# Patient Record
Sex: Female | Born: 1945 | Race: Black or African American | Hispanic: No | Marital: Married | Smoking: Never smoker
Health system: Southern US, Community
[De-identification: ages and names within clinical notes are randomized; demographics above are authoritative.]

## PROBLEM LIST (undated history)

## (undated) DIAGNOSIS — M1990 Unspecified osteoarthritis, unspecified site: Secondary | ICD-10-CM

## (undated) DIAGNOSIS — I10 Essential (primary) hypertension: Secondary | ICD-10-CM

## (undated) HISTORY — DX: Unspecified osteoarthritis, unspecified site: M19.90

## (undated) HISTORY — PX: ABDOMINAL HYSTERECTOMY: SHX81

## (undated) HISTORY — PX: GALLBLADDER SURGERY: SHX652

## (undated) HISTORY — DX: Essential (primary) hypertension: I10

---

## 2004-06-20 ENCOUNTER — Emergency Department (HOSPITAL_COMMUNITY): Admission: EM | Admit: 2004-06-20 | Discharge: 2004-06-20 | Payer: Self-pay | Admitting: Family Medicine

## 2008-03-21 ENCOUNTER — Encounter: Admission: RE | Admit: 2008-03-21 | Discharge: 2008-03-21 | Payer: Self-pay | Admitting: Internal Medicine

## 2008-05-31 ENCOUNTER — Ambulatory Visit (HOSPITAL_COMMUNITY): Admission: RE | Admit: 2008-05-31 | Discharge: 2008-05-31 | Payer: Self-pay | Admitting: Surgery

## 2008-05-31 ENCOUNTER — Encounter (INDEPENDENT_AMBULATORY_CARE_PROVIDER_SITE_OTHER): Payer: Self-pay | Admitting: Surgery

## 2009-05-03 IMAGING — US US ABDOMEN COMPLETE
1 series · 13 of 25 positions shown · non-contrast
Comparison: None available

CLINICAL DATA: Right upper abdominal pain

ABDOMEN ULTRASOUND
TECHNIQUE: Complete abdominal ultrasound examination was performed
including evaluation of the liver, gallbladder, bile ducts,
pancreas, kidneys, spleen, IVC, and abdominal aorta.

[Series 1: us abdomen complete · 0.32mm/px · 13 of 79 slices shown]
[im 1/79]
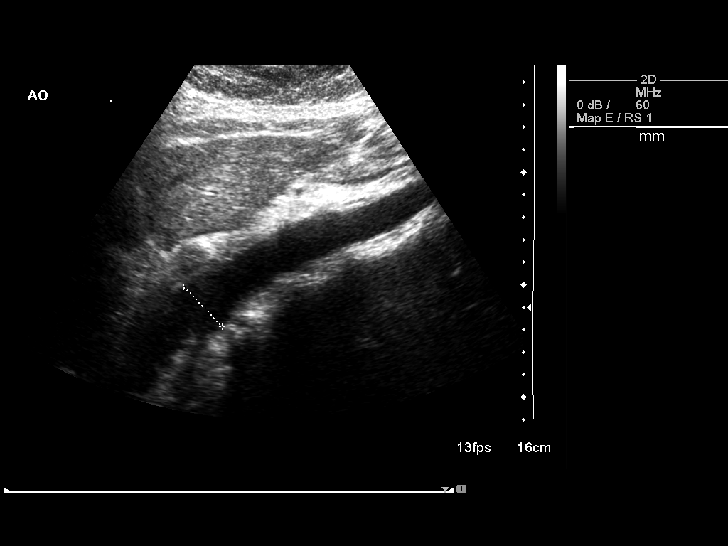
[im 7/79]
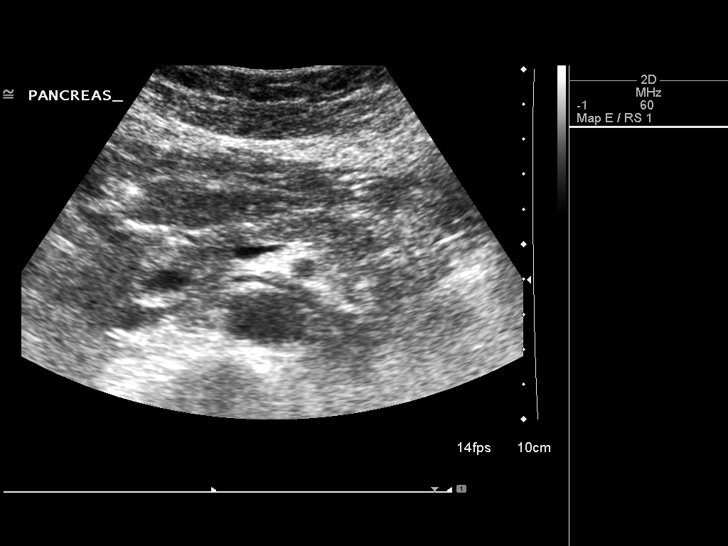
[im 14/79]
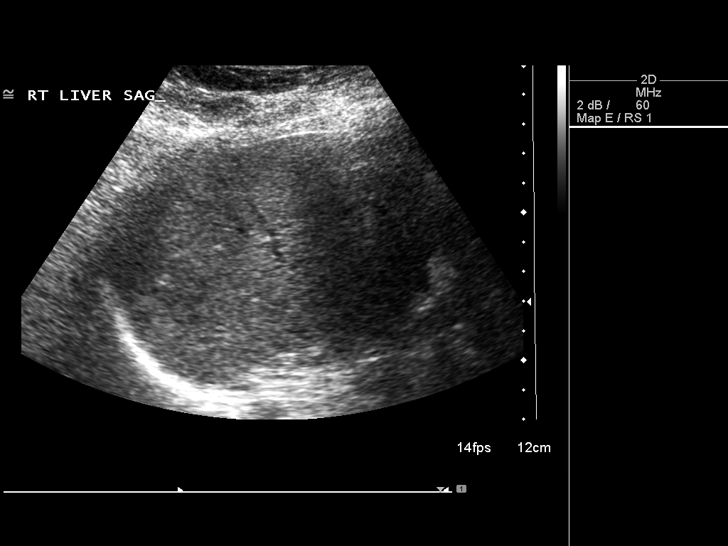
[im 20/79]
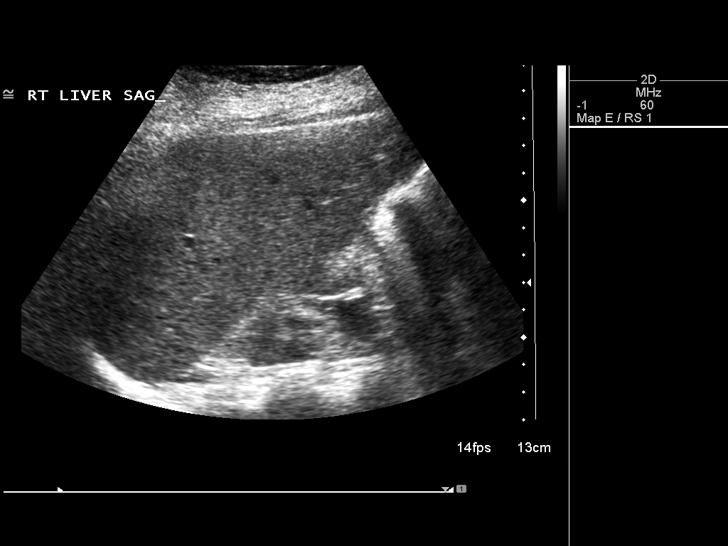
[im 27/79]
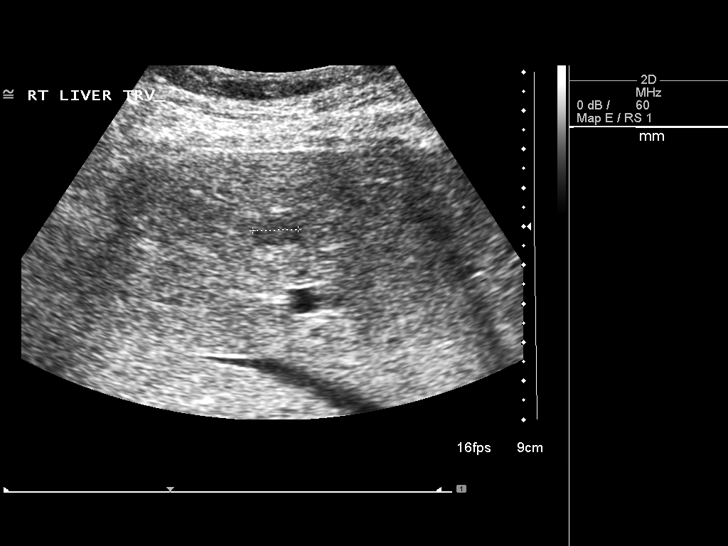
[im 33/79]
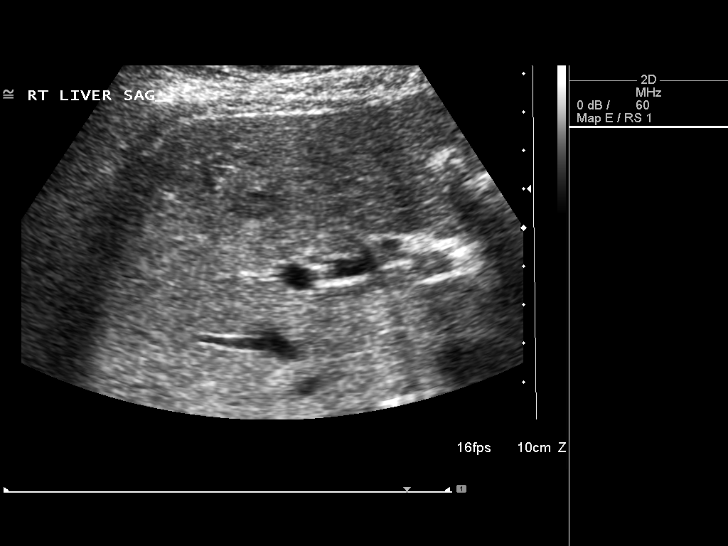
[im 40/79]
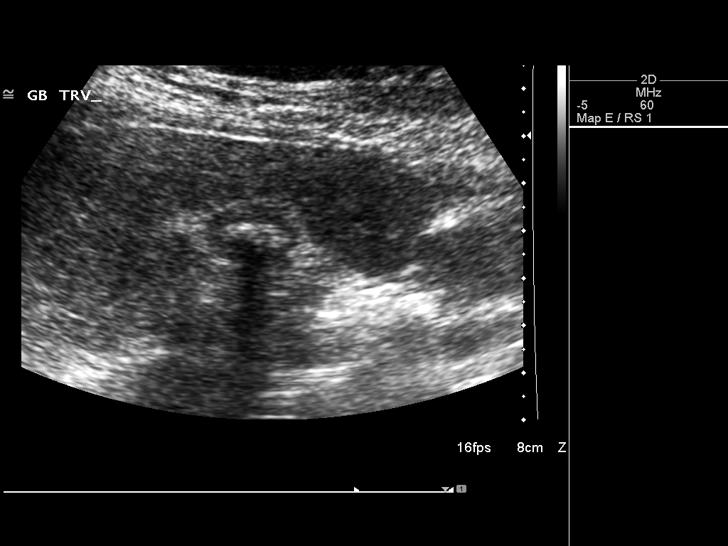
[im 46/79]
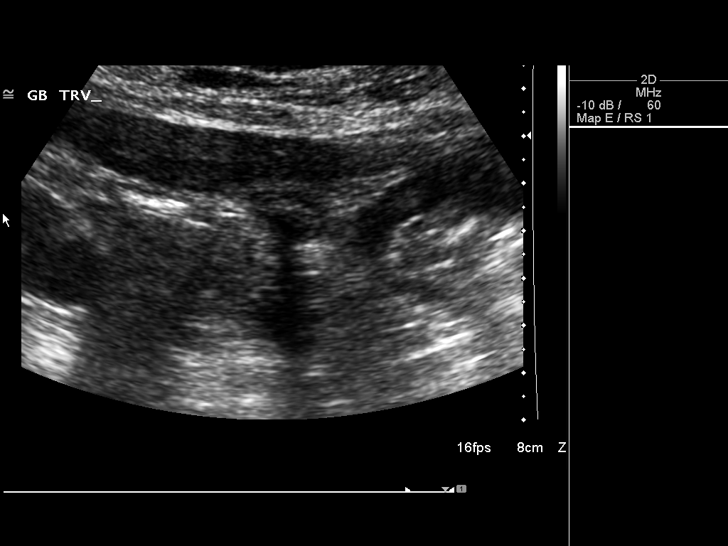
[im 53/79]
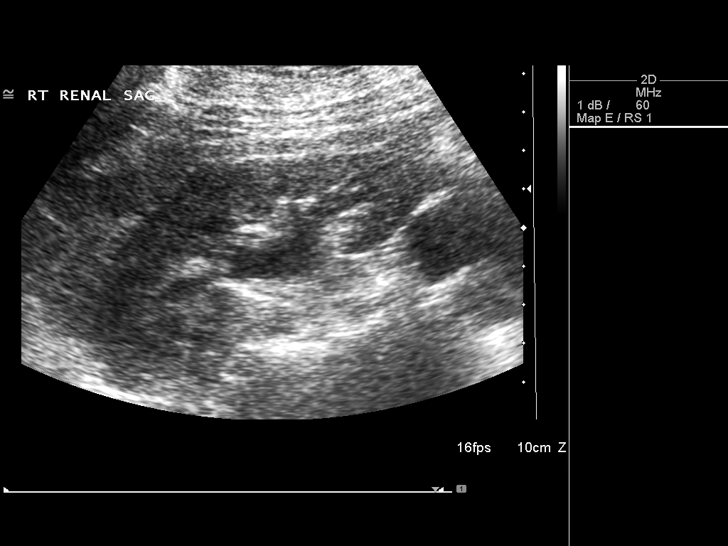
[im 59/79]
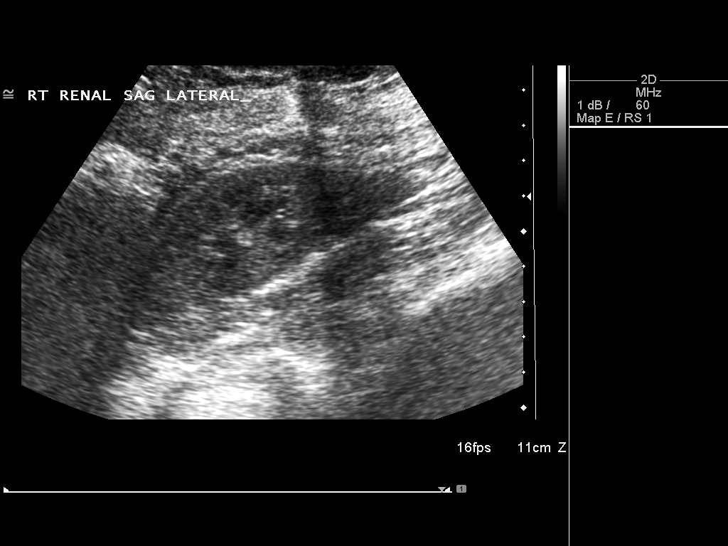
[im 66/79]
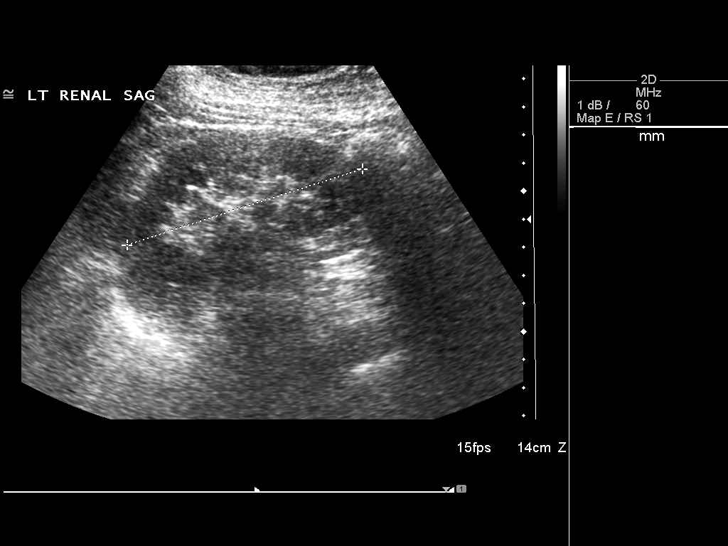
[im 72/79]
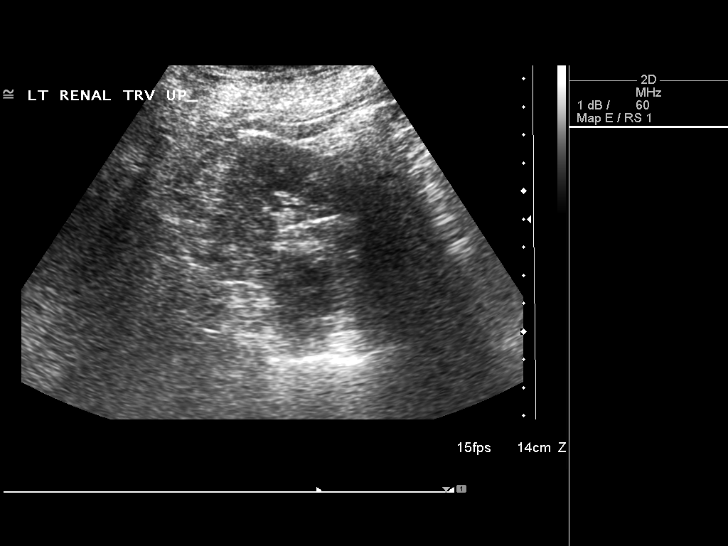
[im 79/79]
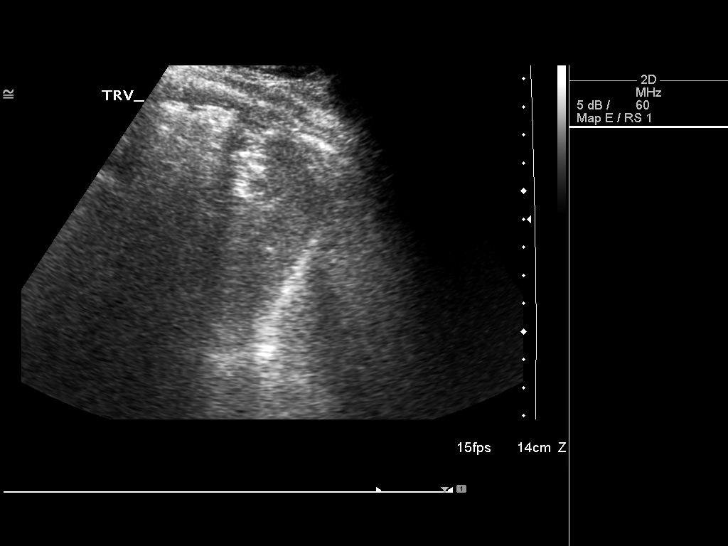

[13 of 25 positions shown; findings below may reference images not displayed]

FINDINGS: Gallbladder:  The gallbladder is incompletely distended, containing
multiple small gallstones.  No pericholecystic fluid. Negative
sonographic Murphy's sign per the ultrasound technologist.

Common bile duct: Mildly dilated, measuring up to 8.3 mm diameter.

Liver:  Normal size and echotexture.  There is a 7 x 11 x 13 mm
slightly hypoechoic sharply demarcated lesion in the anterior right
hepatic segment.  No intrahepatic bile duct dilatation..

Inferior vena cava:  Patent.

Pancreas:  Visualized portions unremarkable.

Spleen:  Normal size and echotexture without focal parenchymal
abnormalities.

Right kidney:  There is mild right hydronephrosis.   Normal
parenchymal echotexture without focal abnormalities.

Left kidney:  No hydronephrosis. Normal parenchymal echotexture
without focal abnormalities.

Abdominal aorta:  Visualized portions normal in caliber,
unremarkable..
IMPRESSION: 1.  Cholelithiasis with mildly dilated common bile duct.
2.  Nonspecific 13 mm liver lesion.  In the absence of any previous
studies to demonstrate stability, consider dedicated liver CT or MR
for further characterization.
3.  Mild right hydronephrosis

## 2009-07-11 IMAGING — CR DG CHEST 2V
2 series · 2 of 2 positions shown · non-contrast
Comparison: None

CLINICAL DATA: Preop for gallstones

CHEST - 2 VIEW

[w chest pa]
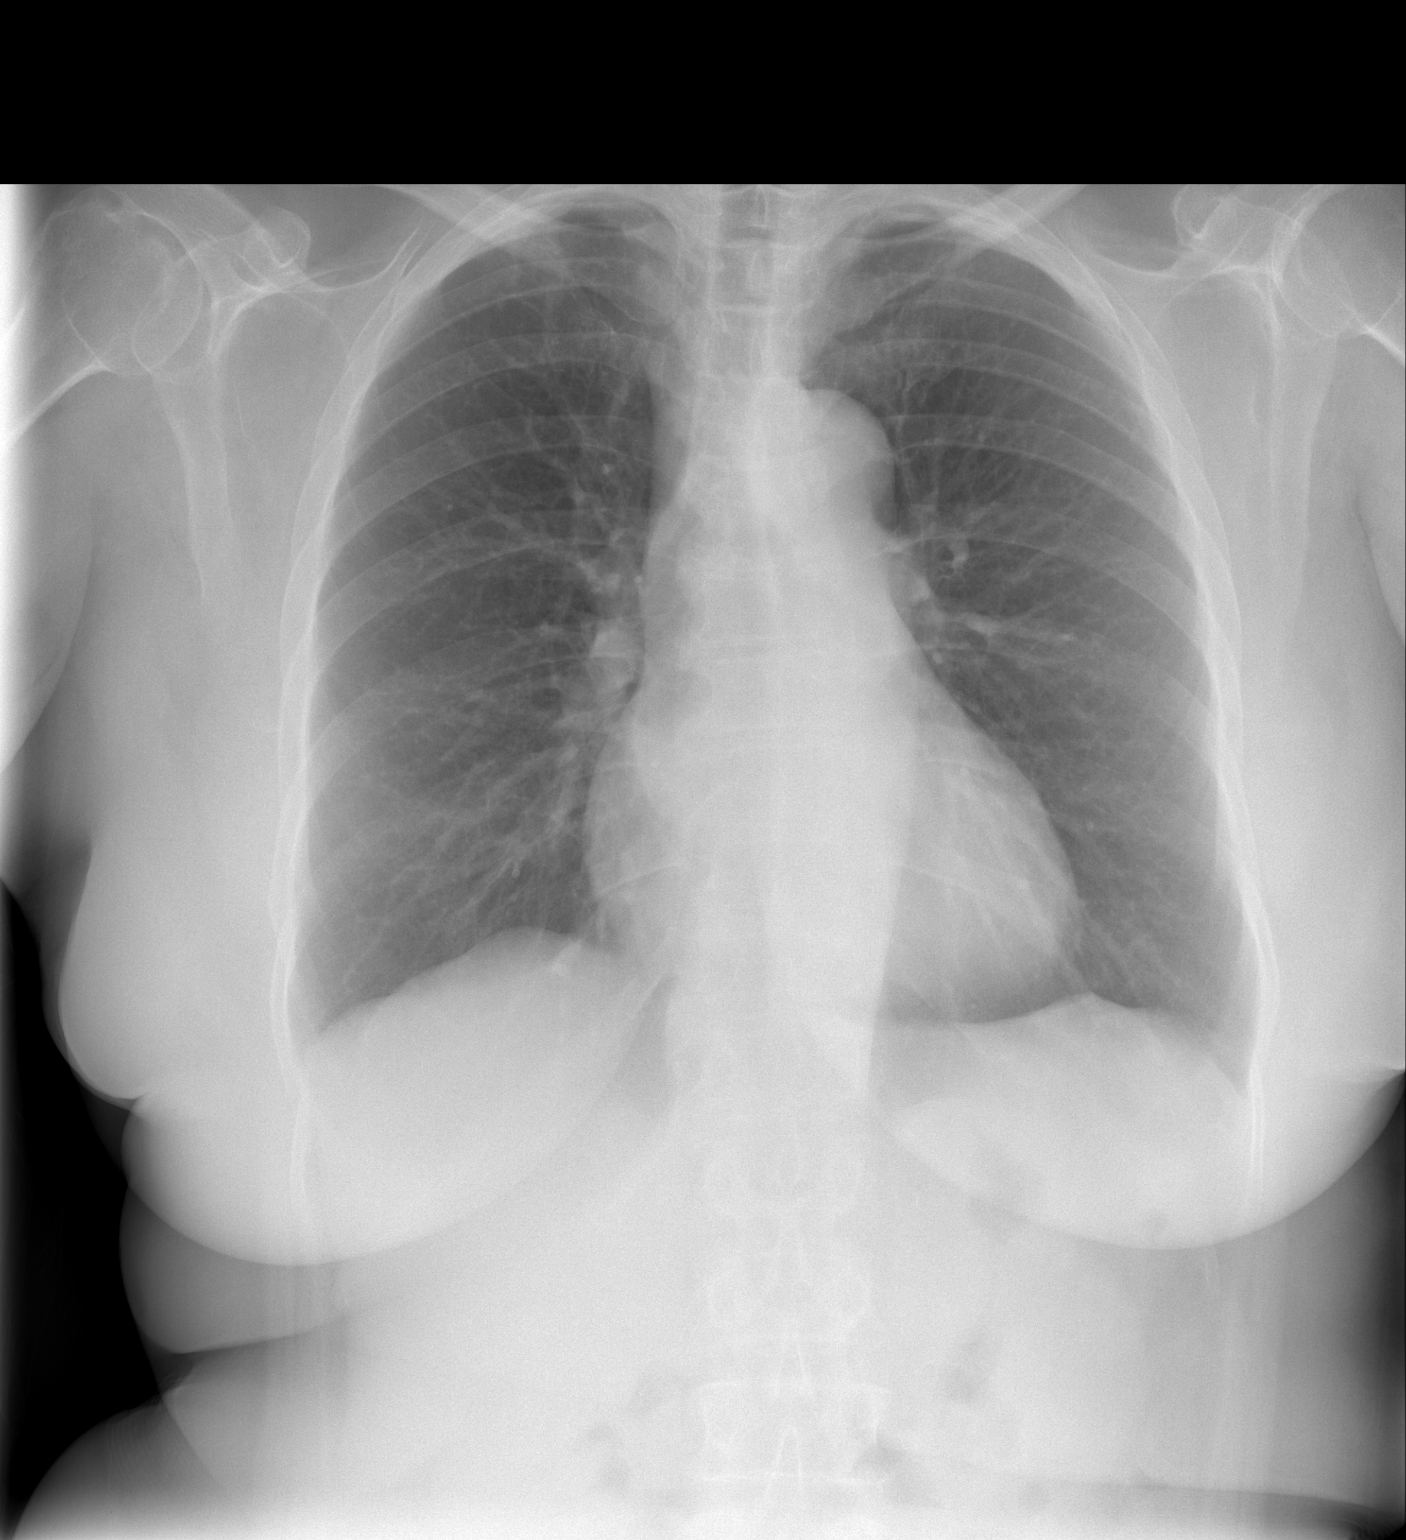

[w chest lat]
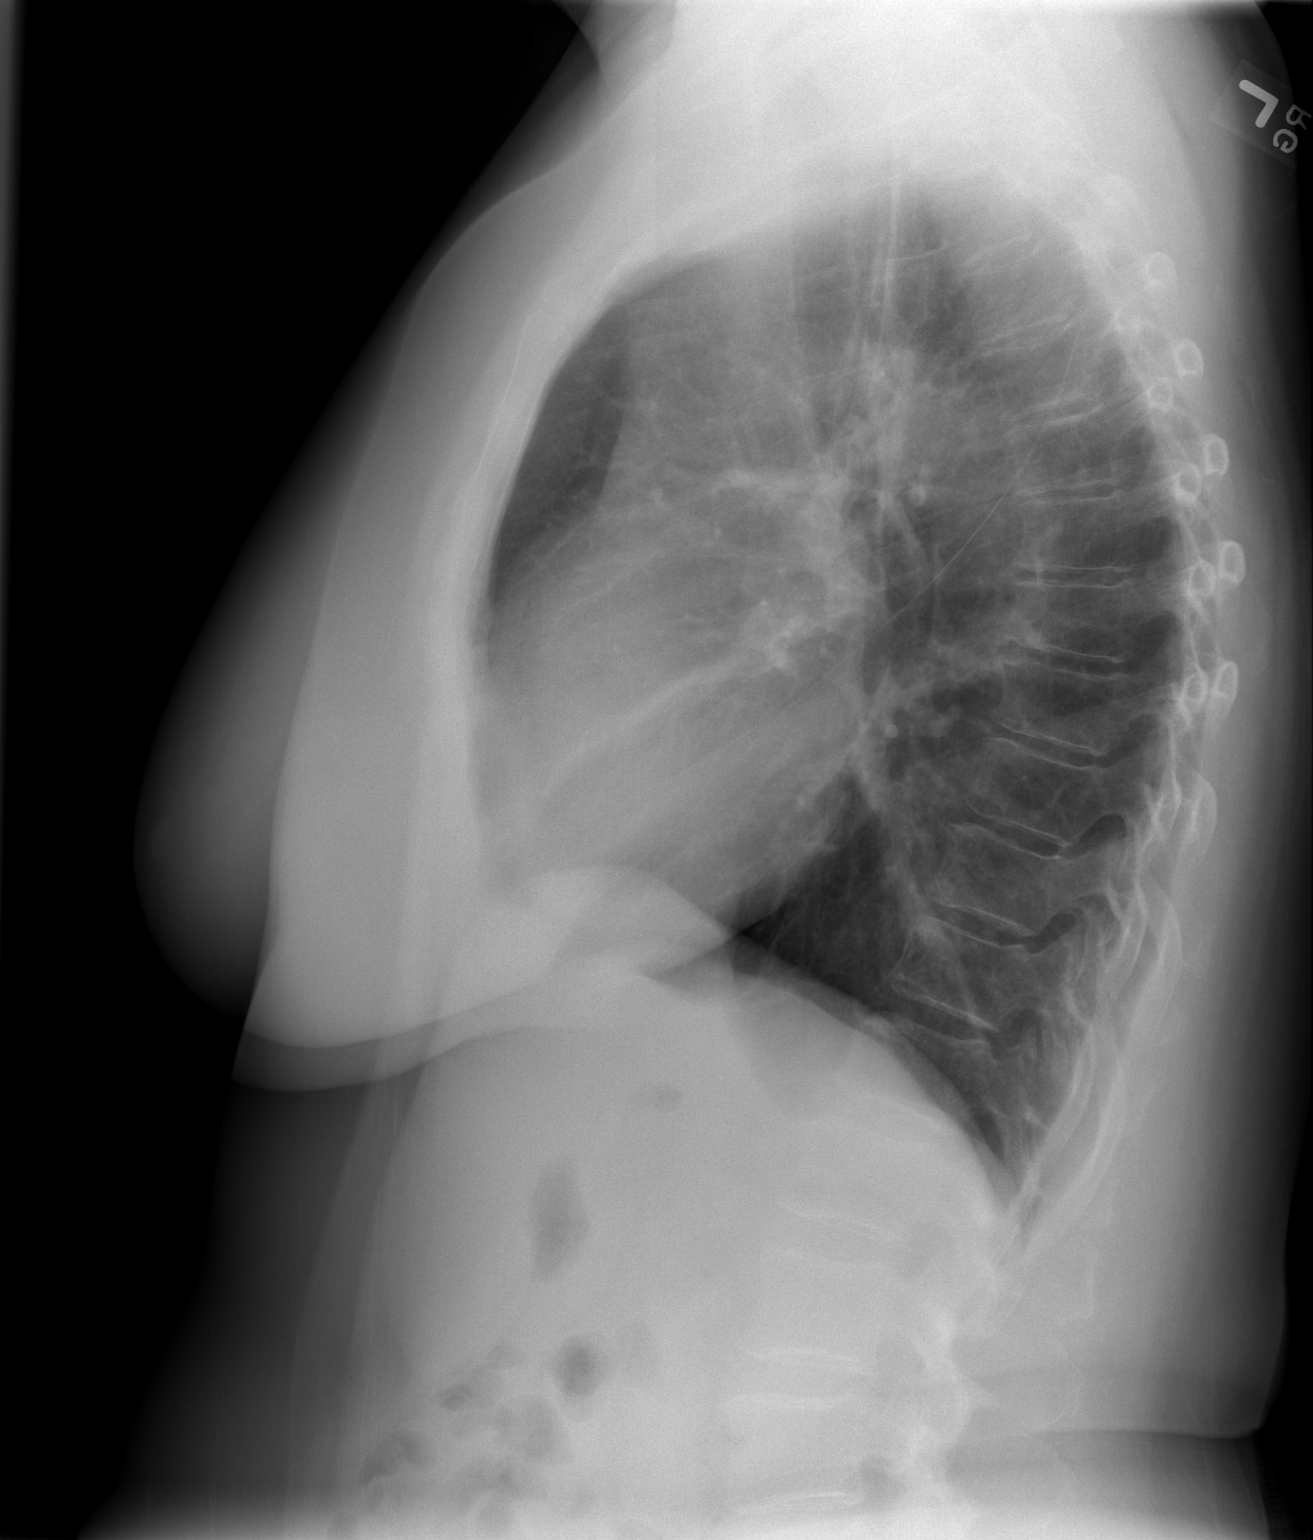

[2 of 2 positions shown; findings below may reference images not displayed]

FINDINGS: The cardiomediastinal silhouette is unremarkable.  No
acute infiltrate or pleural effusion.  No pulmonary edema.  Mild
degenerative changes mid thoracic spine are seen.
IMPRESSION: No active disease.

## 2009-07-13 IMAGING — RF DG CHOLANGIOGRAM OPERATIVE
1 series · 8 of 8 positions shown · non-contrast
Comparison: Abdominal ultrasound 03/21/2008.

CLINICAL DATA: Gallstones.  Laparoscopic cholecystectomy.

INTRAOPERATIVE CHOLANGIOGRAM
TECHNIQUE: Multiple fluoroscopic spot radiographs were obtained
during intraoperative cholangiogram and are submitted for
interpretation post-operatively.

[Series 1: run · 2 acquisitions, 8 frames shown]
[im 1/2]
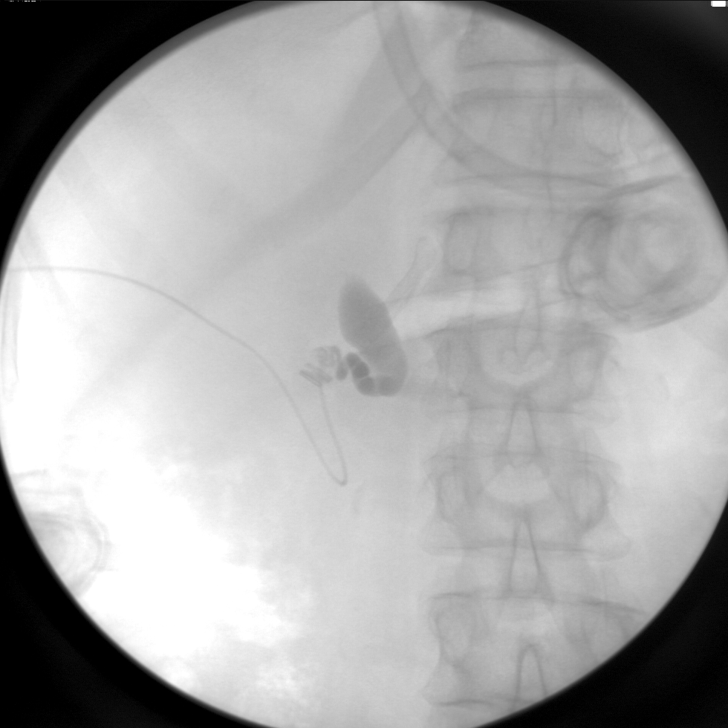
[im 1/2]
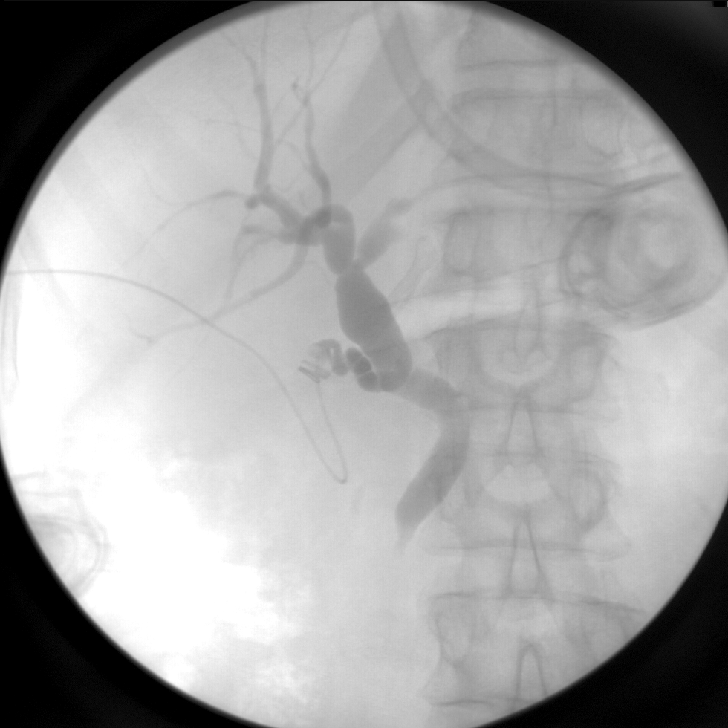
[im 1/2]
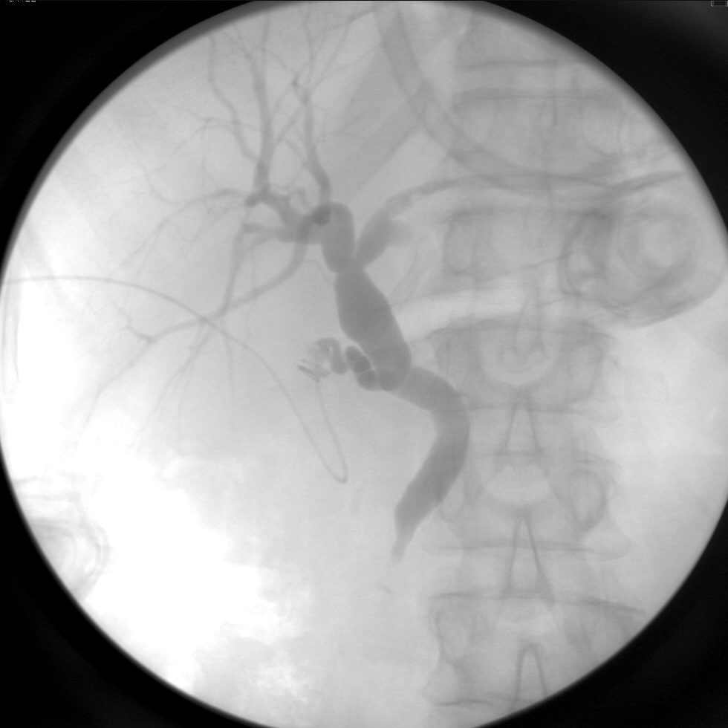
[im 1/2]
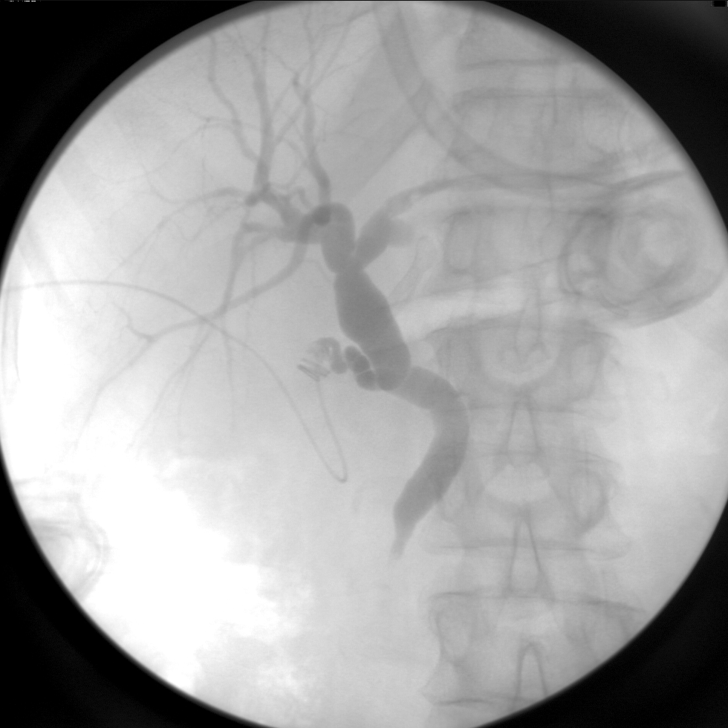
[im 2/2]
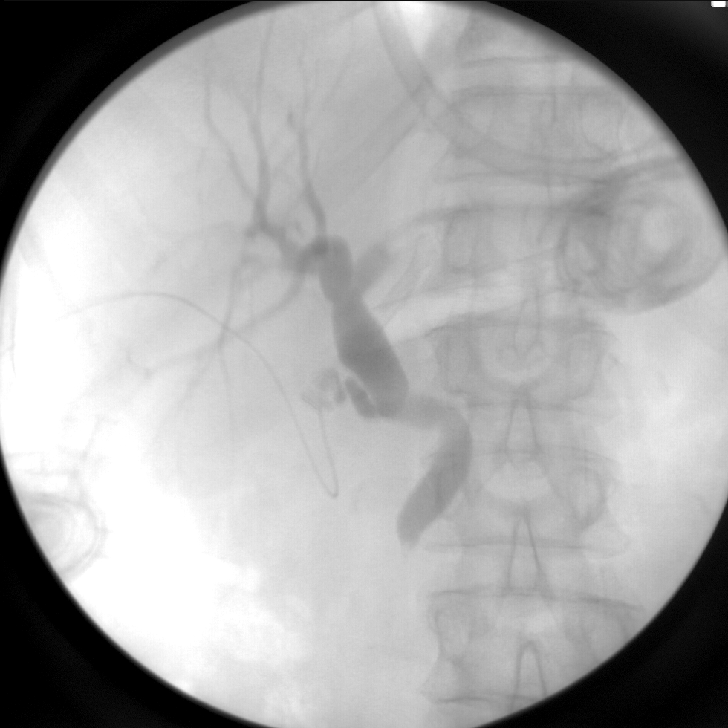
[im 2/2]
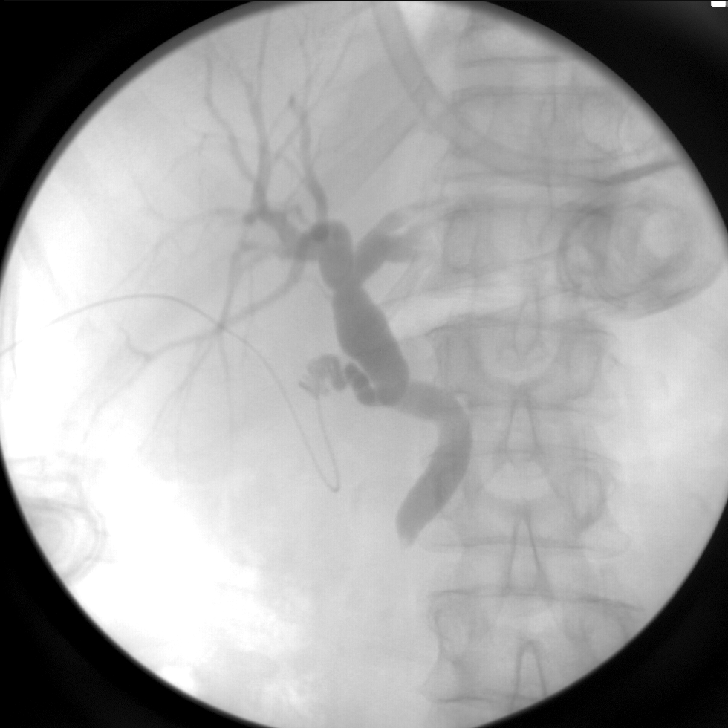
[im 2/2]
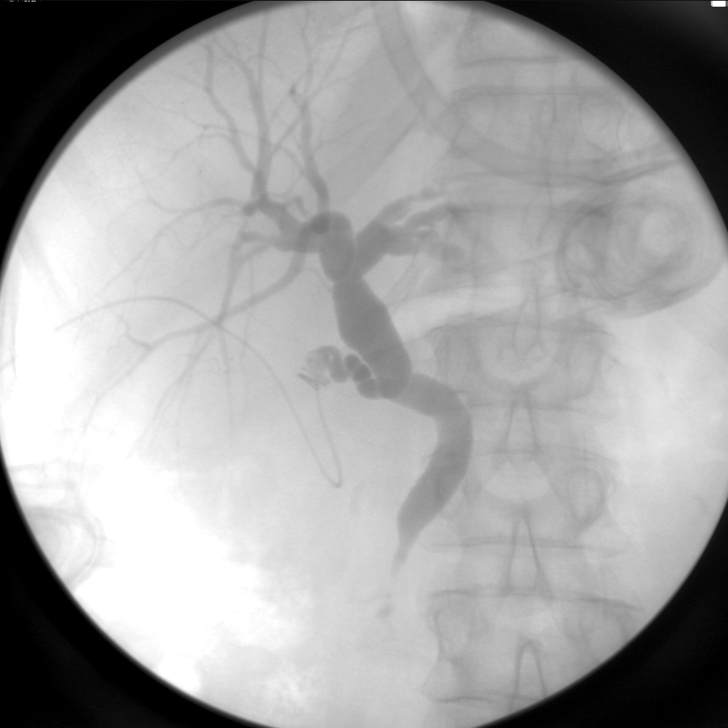
[im 2/2]
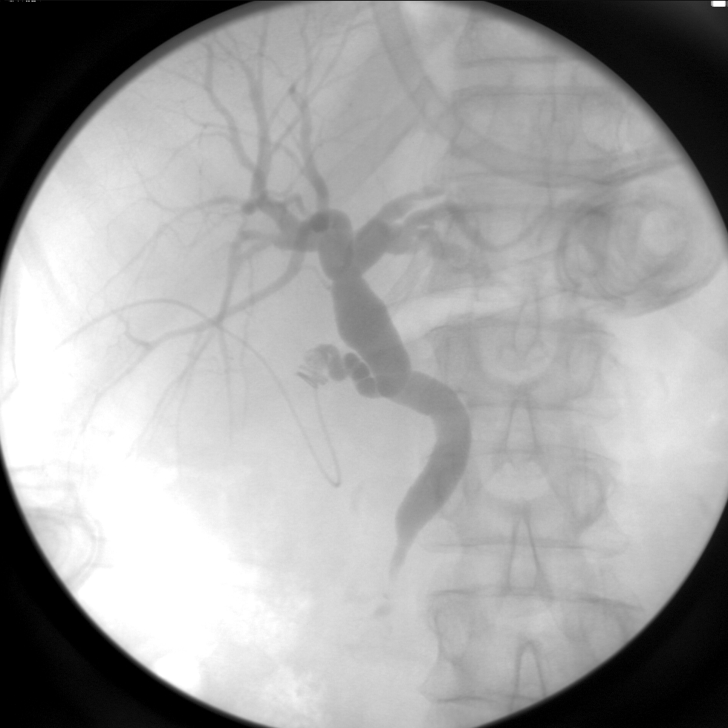

[8 of 8 positions shown; findings below may reference images not displayed]

FINDINGS: Injection of the cystic duct remnant demonstrates
persistent mild intra and extrahepatic biliary dilatation.  On the
first run, there is no drainage into the duodenum.  A small filling
defect is noted in the left hepatic duct which may reflect an air
bubble or small calculus.

A second series was obtained and show a small amount of drainage
into the duodenum.  There is suspicion of tiny calculi in the
distal common bile duct.  There may be small calculi in the left
hepatic duct.
IMPRESSION: 1.  Persistent biliary dilatation with possible small calculi in
the left hepatic duct and the distal common bile duct.
2.  A small amount of drainage into the duodenum is noted on the
second series.  There is no extravasation.

## 2009-08-28 ENCOUNTER — Ambulatory Visit: Payer: Self-pay | Admitting: Internal Medicine

## 2009-09-11 ENCOUNTER — Ambulatory Visit: Payer: Self-pay | Admitting: Internal Medicine

## 2011-04-27 NOTE — Op Note (Signed)
NAMEGERALDYN, SHAIN                  ACCOUNT NO.:  [id number]   MEDICAL RECORD NO.:  [id number]          PATIENT TYPE:  AMB   LOCATION:  DAY                          FACILITY:  [facility]   PHYSICIAN:  Wilmon Arms. Corliss Skains, M.D. DATE OF BIRTH:  1946/01/29   DATE OF PROCEDURE:  05/31/2008  DATE OF DISCHARGE:                               OPERATIVE REPORT   PREOPERATIVE DIAGNOSIS:  Chronic calculus cholecystitis.   POSTOPERATIVE DIAGNOSIS:  Chronic calculus cholecystitis.   PROCEDURE PERFORMED:  Laparoscopic cholecystectomy with intraoperative  cholangiogram.   SURGEON:  Wilmon Arms. Corliss Skains, M.D., FACS   ASSISTANT:  Clovis Pu. Cornett, M.D.   ANESTHESIA:  General endotracheal.   INDICATIONS:  The patient is a 65 year old female who presents with a 6  month history of intermittent epigastric and right upper quadrant pain.  This tends to be postprandial and associated with bloating, nausea,  vomiting and diarrhea.  She has been controlling her diet and taking  Protonix and this has helped somewhat.  Ultrasound showed multiple  gallstones.  No sign of wall thickening.  She had a dilated common bile  duct at 8.3 mm.  However, her preoperative liver function tests were  within normal limits.   DESCRIPTION OF PROCEDURE:  The patient was brought to the operating  room, placed in supine position on the operating table.  After adequate  level of general anesthesia was obtained the patient's abdomen was  prepped with Betadine and draped in a sterile fashion.  Time-out was  taken to assure the proper patient and proper procedure.  The area below  her umbilicus was infiltrated with 0.25% Marcaine with epinephrine.  A  transverse incision was made below her umbilicus.  Dissection was  carried down to the fascia which was opened vertically.  The peritoneal  cavity was bluntly entered.  A stay suture of zero Vicryl was placed  around the fascial opening.  The Hasson cannula was inserted and secured  with a  stay suture.  Pneumoperitoneum was obtained by insufflating CO2  maintaining a maximal pressure of 15 mmHg.  The laparoscope was inserted  and the patient was positioned in reverse Trendelenburg to her left.  An  11 mm port was placed in subxiphoid position.  Two 5 mm ports were  placed in the right upper quadrant.  The gallbladder was grasped with a  clamp and elevated over the edge of the liver.  A fairly prominent  common bile duct was easily seen.  We carefully dissected around the  hilum of the gallbladder.  The patient has a fairly short cystic duct.  We were careful to stay high near the gallbladder and meticulously  dissected around the cystic duct.  After dissecting around the duct we  were able to provide a little more length of the cystic duct.  A clip  was placed on the cystic duct distally.  A small opening was created on  the cystic duct and a Cook cholangiogram catheter was inserted through a  stab incision.  We milked back some sludge from the cystic duct opening  and threaded  in the cholangiogram catheter.  It was secured with a clip.  A cholangiogram was obtained which showed good flow proximally and  distally in the biliary tree.  The common duct appeared dilated.  Contrast flowed slowly into the duodenum.  There were no obvious  meniscal signs of filling defects but there may be some spasm or a small  amount of sludge right at the ampulla.  However, contrast was seen  entering the duodenum.  Therefore the decision was made to complete the  operation.  We removed the catheter and ligated the cystic duct with  clips and divided.  The cystic artery was ligated with clips and  divided.  Cautery was then used to remove the gallbladder from the liver  bed.  The gallbladder was placed in an EndoCatch sac.  We irrigated the  right upper quadrant and no bleeding was noted.  The gallbladder in  EndoCatch sac were removed from the umbilical port site.  The fascia was  closed with  a pursestring suture.  Pneumoperitoneum was then released as  we removed the remainder of the trocars.  Monocryl 4-0 was used to close  all the skin incisions.  Steri-Strips and clean dressings were applied.  The patient was extubated and brought to recovery in stable condition.  All sponge, instrument and needle counts were correct.   We will plan to recheck her blood work for liver function abnormalities  next week.  Since her preoperative liver function tests were normal I do  not feel that there is any indication at this time for an ERCP.      Wilmon Arms. Tsuei, M.D.  Electronically Signed     MKT/MEDQ  D:  05/31/2008  T:  05/31/2008  Job:  [id number]   cc:   Della Goo, M.D.  Fax: [phone]

## 2011-05-13 ENCOUNTER — Encounter: Payer: Self-pay | Admitting: Internal Medicine

## 2011-07-05 ENCOUNTER — Encounter: Payer: Self-pay | Admitting: Internal Medicine

## 2011-07-06 ENCOUNTER — Other Ambulatory Visit (HOSPITAL_COMMUNITY)
Admission: RE | Admit: 2011-07-06 | Discharge: 2011-07-06 | Disposition: A | Discharge status: Home / Self Care | Payer: Medicare HMO | Source: Ambulatory Visit | Attending: Internal Medicine | Admitting: Internal Medicine

## 2011-07-06 ENCOUNTER — Encounter: Payer: Self-pay | Admitting: Internal Medicine

## 2011-07-06 ENCOUNTER — Ambulatory Visit (INDEPENDENT_AMBULATORY_CARE_PROVIDER_SITE_OTHER): Payer: Medicare HMO | Admitting: Internal Medicine

## 2011-07-06 ENCOUNTER — Other Ambulatory Visit: Payer: Self-pay | Admitting: Internal Medicine

## 2011-07-06 DIAGNOSIS — I10 Essential (primary) hypertension: Secondary | ICD-10-CM

## 2011-07-06 DIAGNOSIS — Z124 Encounter for screening for malignant neoplasm of cervix: Secondary | ICD-10-CM

## 2011-07-06 DIAGNOSIS — Z01419 Encounter for gynecological examination (general) (routine) without abnormal findings: Secondary | ICD-10-CM | POA: Insufficient documentation

## 2011-07-06 DIAGNOSIS — Z0000 Encounter for general adult medical examination without abnormal findings: Secondary | ICD-10-CM

## 2011-07-06 DIAGNOSIS — Z23 Encounter for immunization: Secondary | ICD-10-CM

## 2011-07-06 DIAGNOSIS — N9489 Other specified conditions associated with female genital organs and menstrual cycle: Secondary | ICD-10-CM

## 2011-07-06 DIAGNOSIS — N898 Other specified noninflammatory disorders of vagina: Secondary | ICD-10-CM

## 2011-07-06 LAB — CBC WITH DIFFERENTIAL/PLATELET
Basophils Absolute: 0.1 10*3/uL (ref 0.0–0.1)
Basophils Relative: 1 % (ref 0–1)
Eosinophils Absolute: 0.3 10*3/uL (ref 0.0–0.7)
Eosinophils Relative: 5 % (ref 0–5)
HCT: 37.4 % (ref 36.0–46.0)
Hemoglobin: 12.4 g/dL (ref 12.0–15.0)
Platelets: 266 10*3/uL (ref 150–400)

## 2011-07-06 LAB — COMPREHENSIVE METABOLIC PANEL
AST: 18 U/L (ref 0–37)
Alkaline Phosphatase: 90 U/L (ref 39–117)
Calcium: 9.8 mg/dL (ref 8.4–10.5)
Chloride: 105 mEq/L (ref 96–112)
Creat: 0.95 mg/dL (ref 0.50–1.10)
Glucose, Bld: 96 mg/dL (ref 70–99)
Potassium: 4.7 mEq/L (ref 3.5–5.3)
Total Bilirubin: 0.5 mg/dL (ref 0.3–1.2)
Total Protein: 7.4 g/dL (ref 6.0–8.3)

## 2011-07-06 LAB — LIPID PANEL
HDL: 85 mg/dL (ref >39)
LDL Cholesterol: 180 mg/dL — ABNORMAL HIGH (ref 0–99)
VLDL: 14 mg/dL (ref 0–40)

## 2011-07-06 LAB — POCT URINALYSIS DIPSTICK
Bilirubin, UA: NEGATIVE
Blood, UA: NEGATIVE
Nitrite, UA: NEGATIVE
Spec Grav, UA: 1.02

## 2011-07-06 NOTE — Progress Notes (Signed)
Subjective:    Patient ID: Sharon Russell, female    DOB: [birth year], 65 y.o.   MRN: [id number]  HPI pleasant 65 year old black female presents to the office for the first time today. History of hypertension for the past approximate 7 years. Currently on metoprolol 100 mg twice daily, lisinopril/HCTZ 20/25 daily and potassium supplement 10 mEq daily. She takes a baby aspirin daily 81 mg.  History of abdominal hysterectomy without oophorectomy in 1980 for fibroids. History of cholecystectomy in 2009. Had colonoscopy in 2011 by Dr. Juanda Chance. Tetanus immunization update given today with pertussis component. Also given Pneumovax immunization today. Will return in approximately 4 weeks to get Zostavax.  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 Husband is retired. Patient does not smoke& never has. Drinks some red wine or couple of beers at night. 3 adult female children.  Family history: father died of apparent cardiac arrest. He had a pacemaker. He was 26 years old. Mother died at age 22 in her sleep. 3 brothers: one living, one died of complications of alcoholism with history of seizure disorder, another died at age 80 with MI. 3 sisters: one of whom died with a stroke & 2 are living. Mother had history of diabetes as does one of her sisters. Mother had hypertension. One brother with hypertension and one sister with hypertension.    Review of Systems  Constitutional: Negative.   HENT: Negative.   Eyes: Negative.   Respiratory: Negative.   Cardiovascular: Negative.   Gastrointestinal: Negative.   Genitourinary:       Vaginal dryness  Musculoskeletal: Negative.   Neurological:       Numbness right fourth and fifth fingers, intermittent, noted mostly in the mornings when awakening it may be due to position in bed  Hematological: Negative.   Psychiatric/Behavioral: Negative.          Objective:   Physical Exam  Constitutional: She is oriented to person, place, and time. She appears well-developed and well-nourished.  HENT:  Head: Normocephalic and atraumatic.  Right Ear: External ear normal.  Left Ear: External ear normal.  Nose: Nose normal.  Mouth/Throat: Oropharynx is clear and moist. No oropharyngeal exudate.  Eyes: Conjunctivae and EOM are normal. Pupils are equal, round, and reactive to light. No scleral icterus.  Neck: Neck supple. No JVD present. No thyromegaly present.  Cardiovascular: Normal rate, regular rhythm, normal heart sounds and intact distal pulses.   No murmur heard. Pulmonary/Chest: Effort normal and breath sounds normal. No respiratory distress. She has no wheezes. She has no rales. She exhibits no tenderness.  Abdominal: Soft. Bowel sounds are normal. She exhibits no distension and no mass. There is no tenderness. There is no rebound and no guarding.  Genitourinary: No vaginal discharge found.       Pap taken of vaginal cuff  Musculoskeletal: Normal range of motion. She exhibits no edema.  Lymphadenopathy:    She has no cervical adenopathy.  Neurological: She is alert and oriented to person, place, and time. She has normal reflexes. She displays normal reflexes. No cranial nerve deficit.       Right hand full range of motion of all fingers and normal range of motion right wrist. No significant paresthesias etc. tips of right fourth and fifth fingers  Skin: Skin is warm and dry. No rash noted.  Psychiatric: She has a normal mood and affect. Her behavior is normal. Judgment  and thought content normal.          Assessment & Plan:  Hypertension  ? Mild right ulnar neuropathy-may respond to wrist splint or change in position while sleeping  Health maintenance Tdap and Pneumovax vaccines given. Is return in 4 weeks for Zostavax vaccine. Otherwise recheck in 6 months  Vaginal dryness-prescribed Premarin vaginal cream to use 1 applicator full  in vagina 3 times weekly with when necessary 1 year refill. Have refilled potassium supplement 10 mEq daily for one year. Have refilled metoprolol for 6 months and lisinopril/HCTZ 20/25 for one year. Return in 6 months for office visit& blood pressure check. Patient doesn't usually take influenza immunization she says.

## 2011-07-06 NOTE — Patient Instructions (Signed)
Continue antihypertensive medications as directed. We will notify you of lab results

## 2011-07-07 ENCOUNTER — Encounter: Payer: Self-pay | Admitting: Internal Medicine

## 2011-08-03 ENCOUNTER — Encounter: Payer: Self-pay | Admitting: Internal Medicine

## 2011-08-03 ENCOUNTER — Ambulatory Visit: Payer: Medicare HMO | Admitting: Internal Medicine

## 2011-08-03 ENCOUNTER — Ambulatory Visit (INDEPENDENT_AMBULATORY_CARE_PROVIDER_SITE_OTHER): Payer: Medicare HMO | Admitting: Internal Medicine

## 2011-08-03 VITALS — BP 128/82 | HR 82 | Temp 96.9°F | Ht 62.5 in | Wt 193.0 lb

## 2011-08-03 DIAGNOSIS — Z23 Encounter for immunization: Secondary | ICD-10-CM

## 2011-08-03 DIAGNOSIS — E785 Hyperlipidemia, unspecified: Secondary | ICD-10-CM

## 2011-08-03 DIAGNOSIS — I10 Essential (primary) hypertension: Secondary | ICD-10-CM

## 2011-08-03 DIAGNOSIS — E119 Type 2 diabetes mellitus without complications: Secondary | ICD-10-CM

## 2011-09-09 DIAGNOSIS — I10 Essential (primary) hypertension: Secondary | ICD-10-CM | POA: Insufficient documentation

## 2011-09-09 DIAGNOSIS — E785 Hyperlipidemia, unspecified: Secondary | ICD-10-CM | POA: Insufficient documentation

## 2011-09-09 LAB — DIFFERENTIAL
Basophils Absolute: 0.1
Lymphs Abs: 2.5
Neutro Abs: 2.3
Neutrophils Relative %: 41 — ABNORMAL LOW

## 2011-09-09 LAB — COMPREHENSIVE METABOLIC PANEL
ALT: 18
Albumin: 3.9
BUN: 14
CO2: 26
Calcium: 9.7
Chloride: 104
GFR calc Af Amer: >60
GFR calc non Af Amer: >60
Glucose, Bld: 110 — ABNORMAL HIGH
Sodium: 138
Total Protein: 7.2

## 2011-09-09 LAB — CBC
HCT: 37.2
Hemoglobin: 12.6
MCHC: 34
WBC: 5.7

## 2011-09-09 NOTE — Progress Notes (Signed)
  Subjective:    Patient ID: Sharon Russell, female    DOB: [birth year], 65 y.o.   MRN: [id number]  HPI patient in today to followup on initial visit approximate 4 weeks ago. Long-standing history of hypertension. We discovered she has significant hyperlipidemia and hemoglobin A1c was elevated at 6.3%. Have discussed these issues with her today. Am recommending statin therapy for hyperlipidemia with diagnosis of diabetes mellitus. She will receive a home blood glucose monitor and start monitoring Accu-Cheks at least once daily with followup in 6-8 weeks with repeat hemoglobin A1c and fasting lipid panel. Reminded about diabetic eye exam on an annual basis. Will need annual influenza immunization. Received Pneumovax July 2012 and Tdap July 2012. Had colonoscopy 2011. Had mammogram July 2012    Review of Systems     Objective:   Physical Exam chest clear.dictation, cardiac exam regular rate and rhythm, pulses in feet are normal, extremities without edema        Assessment & Plan:  Hypertension  Hyperlipidemia  Diabetes mellitus  Start statin therapy, patient is to follow diabetic diet, monitor Accu-Cheks, followup in 8 weeks with hemoglobin A1c and fasting lipid panel

## 2011-11-11 ENCOUNTER — Other Ambulatory Visit: Payer: Medicare HMO | Admitting: Internal Medicine

## 2011-11-11 DIAGNOSIS — E785 Hyperlipidemia, unspecified: Secondary | ICD-10-CM

## 2011-11-11 DIAGNOSIS — Z79899 Other long term (current) drug therapy: Secondary | ICD-10-CM

## 2011-11-11 LAB — LIPID PANEL
LDL Cholesterol: 116 mg/dL — ABNORMAL HIGH (ref 0–99)
Total CHOL/HDL Ratio: 2.6 Ratio
VLDL: 15 mg/dL (ref 0–40)

## 2011-11-11 LAB — HEMOGLOBIN A1C: Mean Plasma Glucose: 128 mg/dL — ABNORMAL HIGH (ref <117)

## 2011-11-11 LAB — HEPATIC FUNCTION PANEL
AST: 20 U/L (ref 0–37)
Albumin: 4.6 g/dL (ref 3.5–5.2)
Bilirubin, Direct: 0.2 mg/dL (ref 0.0–0.3)
Total Bilirubin: 0.5 mg/dL (ref 0.3–1.2)
Total Protein: 7.4 g/dL (ref 6.0–8.3)

## 2011-11-15 ENCOUNTER — Ambulatory Visit (INDEPENDENT_AMBULATORY_CARE_PROVIDER_SITE_OTHER): Payer: Medicare HMO | Admitting: Internal Medicine

## 2011-11-15 ENCOUNTER — Encounter: Payer: Self-pay | Admitting: Internal Medicine

## 2011-11-15 VITALS — BP 116/64 | HR 76 | Temp 98.0°F | Wt 190.0 lb

## 2011-11-15 DIAGNOSIS — I10 Essential (primary) hypertension: Secondary | ICD-10-CM

## 2011-11-15 DIAGNOSIS — R7309 Other abnormal glucose: Secondary | ICD-10-CM

## 2011-11-15 DIAGNOSIS — E785 Hyperlipidemia, unspecified: Secondary | ICD-10-CM

## 2011-11-15 DIAGNOSIS — R7302 Impaired glucose tolerance (oral): Secondary | ICD-10-CM

## 2011-12-13 ENCOUNTER — Encounter: Payer: Self-pay | Admitting: Internal Medicine

## 2011-12-13 NOTE — Patient Instructions (Addendum)
I am pleased with your progress with glucose control. Continue same medications. Your lipid panel is excellent. See you in 6 months.

## 2011-12-13 NOTE — Progress Notes (Signed)
  Subjective:    Patient ID: Sharon Russell, female    DOB: September 12, 1946, 65 y.o.   MRN: [id number]  HPI 65 year old black female in today for followup on hyperlipidemia, impaired glucose tolerance, hypertension. Fasting lipid panel, liver functions have been drawn-see results. Hemoglobin A1c has improved from 6.3% 6.1% with diet alone. Blood pressure is excellent on current regimen. Recently started on lipid-lowering medication. No complaints or problems. Am pleased with her progress.    Review of Systems     Objective:   Physical Exam chest clear to auscultation; cardiac exam regular rate and rhythm; extremities without edema        Assessment & Plan:  Hypertension  Hyperlipidemia  Impaired glucose tolerance  Plan: Return in 6 months for physical examination. Continue same medications.

## 2012-01-07 ENCOUNTER — Ambulatory Visit: Payer: Medicare HMO | Admitting: Internal Medicine

## 2012-03-05 ENCOUNTER — Other Ambulatory Visit: Payer: Self-pay | Admitting: Internal Medicine

## 2012-07-06 ENCOUNTER — Other Ambulatory Visit: Payer: Medicare HMO | Admitting: Internal Medicine

## 2012-07-06 DIAGNOSIS — E785 Hyperlipidemia, unspecified: Secondary | ICD-10-CM

## 2012-07-06 DIAGNOSIS — R7301 Impaired fasting glucose: Secondary | ICD-10-CM

## 2012-07-06 DIAGNOSIS — I10 Essential (primary) hypertension: Secondary | ICD-10-CM

## 2012-07-06 LAB — CBC WITH DIFFERENTIAL/PLATELET
Basophils Relative: 1 % (ref 0–1)
HCT: 36.5 % (ref 36.0–46.0)
Hemoglobin: 12.1 g/dL (ref 12.0–15.0)
Lymphocytes Relative: 46 % (ref 12–46)
MCHC: 33.2 g/dL (ref 30.0–36.0)
MCV: 83 fL (ref 78.0–100.0)
Platelets: 224 10*3/uL (ref 150–400)
RBC: 4.4 MIL/uL (ref 3.87–5.11)
RDW: 14.6 % (ref 11.5–15.5)
WBC: 5.5 10*3/uL (ref 4.0–10.5)

## 2012-07-06 LAB — LIPID PANEL
Cholesterol: 255 mg/dL — ABNORMAL HIGH (ref 0–200)
HDL: 87 mg/dL (ref >39)
Total CHOL/HDL Ratio: 2.9 Ratio
Triglycerides: 77 mg/dL (ref <150)
VLDL: 15 mg/dL (ref 0–40)

## 2012-07-06 LAB — COMPREHENSIVE METABOLIC PANEL
AST: 20 U/L (ref 0–37)
BUN: 19 mg/dL (ref 6–23)
CO2: 24 mEq/L (ref 19–32)
Calcium: 9.8 mg/dL (ref 8.4–10.5)
Chloride: 103 mEq/L (ref 96–112)
Potassium: 3.8 mEq/L (ref 3.5–5.3)

## 2012-07-06 LAB — HEMOGLOBIN A1C: Hgb A1c MFr Bld: 6.3 % — ABNORMAL HIGH (ref <5.7)

## 2012-07-07 ENCOUNTER — Ambulatory Visit (INDEPENDENT_AMBULATORY_CARE_PROVIDER_SITE_OTHER): Payer: Medicare HMO | Admitting: Internal Medicine

## 2012-07-07 ENCOUNTER — Encounter: Payer: Self-pay | Admitting: Internal Medicine

## 2012-07-07 VITALS — BP 130/72 | HR 68 | Temp 96.9°F | Ht 61.75 in | Wt 193.5 lb

## 2012-07-07 DIAGNOSIS — I10 Essential (primary) hypertension: Secondary | ICD-10-CM

## 2012-07-07 DIAGNOSIS — Z0000 Encounter for general adult medical examination without abnormal findings: Secondary | ICD-10-CM

## 2012-07-07 LAB — POCT URINALYSIS DIPSTICK
Protein, UA: NEGATIVE
Spec Grav, UA: 1.02
Urobilinogen, UA: NEGATIVE

## 2012-07-07 LAB — VITAMIN D 25 HYDROXY (VIT D DEFICIENCY, FRACTURES): Vit D, 25-Hydroxy: 41 ng/mL (ref 30–89)

## 2012-07-07 MED ORDER — METOPROLOL TARTRATE 100 MG PO TABS: 100.0000 mg | ORAL_TABLET | Freq: Two times a day (BID) | ORAL | Status: DC

## 2012-07-07 MED ORDER — LISINOPRIL-HYDROCHLOROTHIAZIDE 20-25 MG PO TABS: 1.0000 | ORAL_TABLET | Freq: Every day | ORAL | Status: DC

## 2012-07-07 MED ORDER — SIMVASTATIN 10 MG PO TABS: 10.0000 mg | ORAL_TABLET | Freq: Every day | ORAL | Status: DC

## 2012-07-07 NOTE — Progress Notes (Signed)
Subjective:    Patient ID: Sharon Russell, female    DOB: 1946/11/04, 66 y.o.   MRN: [id number]  HPI   Subjective:   Patient presents for Medicare Annual/Subsequent preventive examination as well as health maintenance and evaluation of medical problems  Present for the first time to this office in July 2012. Was noted to have hypertension for 7 years prior to that.  History of abdominal hysterectomy without oophorectomy 1980 for fibroids. History of cholecystectomy in 2009. Had colonoscopy in 2011 by Dr. Juanda Chance. Had tetanus immunization update given July 2012.  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 Husband is retired. Never smoked. Social alcohol consumption. 3 adult female children.  Family history: Father died of apparent cardiac arrest with history of pacemaker and was 68 years old. Mother died at age 53 in her sleep. 3 brothers one of whom is living. One died of complications of alcoholism with history of seizure disorder, another brother died at age 52 with MI. Patient had 3 sisters one of whom died with a stroke. 2 sisters are living. Mother had history of diabetes as does one of her sisters. Mother had hypertension. One brother and one sister age with hypertension.  Review Past Medical/Family/Social:see above   Risk Factors  Current exercise habits: walking 3 times a week Dietary issues discussed: low fat low carb  Cardiac risk factors:Hyperlipidemia, HTN  Depression Screen  (Note: if answer to either of the following is "Yes", a more complete depression screening is indicated)   Over the past two weeks, have you felt down, depressed or hopeless? No  Over the past two weeks, have you felt little interest or pleasure in doing things? No Have you lost interest or pleasure in daily life? No Do you often feel hopeless? No Do you cry easily over simple problems? No   Activities  of Daily Living  In your present state of health, do you have any difficulty performing the following activities?:   Driving? No  Managing money? No  Feeding yourself? No  Getting from bed to chair? No  Climbing a flight of stairs? No  Preparing food and eating?: No  Bathing or showering? No  Getting dressed: No  Getting to the toilet? No  Using the toilet:No  Moving around from place to place: No  In the past year have you fallen or had a near fall?:No  Are you sexually active? No  Do you have more than one partner? No   Hearing Difficulties: No  Do you often ask people to speak up or repeat themselves? No  Do you experience ringing or noises in your ears? No  Do you have difficulty understanding soft or whispered voices? No  Do you feel that you have a problem with memory? No Do you often misplace items? No    Home Safety:  Do you have a smoke alarm at your residence? Yes Do you have grab bars in the bathroom? yes Do you have throw rugs in your house? no   Cognitive Testing  Alert? Yes Normal Appearance?Yes  Oriented to person? Yes Place? Yes  Time? Yes  Recall of three objects? Yes  Can perform simple calculations? Yes  Displays appropriate judgment?Yes  Can read the correct time from a watch face?Yes   List the Names of Other Physician/Practitioners you currently use:  See referral list for the physicians patient is currently seeing. No others  Review of Systems: see Epic   Objective:     General appearance: Appears stated age and mildly obese  Head: Normocephalic, without obvious abnormality, atraumatic  Eyes: conj clear, EOMi PEERLA  Ears: normal TM's and external ear canals both ears  Nose: Nares normal. Septum midline. Mucosa normal. No drainage or sinus tenderness.  Throat: lips, mucosa, and tongue normal; teeth and gums normal  Neck: no adenopathy, no carotid bruit, no JVD, supple, symmetrical, trachea midline and thyroid not enlarged, symmetric,  no tenderness/mass/nodules  No CVA tenderness.  Lungs: clear to auscultation bilaterally  Breasts: normal appearance, no masses or tenderness,  Heart: regular rate and rhythm, S1, S2 normal, no murmur, click, rub or gallop  Abdomen: soft, non-tender; bowel sounds normal; no masses, no organomegaly  Musculoskeletal: ROM normal in all joints, no crepitus, no deformity, Normal muscle strengthen. Back  is symmetric, no curvature. Skin: Skin color, texture, turgor normal. No rashes or lesions  Lymph nodes: Cervical, supraclavicular, and axillary nodes normal.  Neurologic: CN 2 -12 Normal, Normal symmetric reflexes. Normal coordination and gait  Psych: Alert & Oriented x 3, Mood appear stable.    Assessment:    Annual wellness medicare exam   Plan:    During the course of the visit the patient was educated and counseled about appropriate screening and preventive services including:  Colonoscopy done 2011 Diet, exercise, and weight loss       Patient Instructions (the written plan) was given to the patient.  Medicare Attestation  I have personally reviewed:  The patient's medical and social history  Their use of alcohol, tobacco or illicit drugs  Their current medications and supplements  The patient's functional ability including ADLs,fall risks, home safety risks, cognitive, and hearing and visual impairment  Diet and physical activities  Evidence for depression or mood disorders  The patient's weight, height, BMI, and visual acuity have been recorded in the chart. I have made referrals, counseling, and provided education to the patient based on review of the above and I have provided the patient with a written personalized care plan for preventive services.           Review of Systems  Constitutional: Negative.   All other systems reviewed and are negative.       Objective:   Physical Exam  Vitals reviewed. Constitutional: She is oriented to person, place, and  time. She appears well-developed and well-nourished. No distress.  HENT:  Head: Normocephalic and atraumatic.  Right Ear: External ear normal.  Left Ear: External ear normal.  Mouth/Throat: Oropharynx is clear and moist. No oropharyngeal exudate.  Eyes: Conjunctivae normal and EOM are normal. Pupils are equal, round, and reactive to light. Right eye exhibits no discharge. Left eye exhibits no discharge. No scleral icterus.  Neck: Neck supple. No JVD present. No thyromegaly present.  Cardiovascular: Normal rate, regular rhythm, normal heart sounds and intact distal pulses.   No murmur heard. Pulmonary/Chest: Effort normal and breath sounds normal. No respiratory distress. She has no wheezes. She has no rales.       Breasts normal female  Abdominal: Soft. Bowel sounds are normal. She exhibits no distension and no mass. There is tenderness. There is no rebound and no guarding.  Musculoskeletal: She exhibits no edema.  Lymphadenopathy:    She has no cervical adenopathy.  Neurological: She is alert and oriented to person, place, and time. She has normal reflexes. No cranial nerve deficit. Coordination normal.  Skin: Skin is warm and dry. No  rash noted. She is not diaphoretic.  Psychiatric: She has a normal mood and affect. Her behavior is normal. Judgment and thought content normal.          Assessment & Plan:  Hypertension  Plan: Continue same medication and return in 6 months.

## 2012-07-07 NOTE — Patient Instructions (Addendum)
Please diet exercise and lose weight. Take meds regularly as directed. Come back in 6 months.

## 2012-07-24 ENCOUNTER — Other Ambulatory Visit: Payer: Self-pay | Admitting: Internal Medicine

## 2012-08-14 ENCOUNTER — Encounter (HOSPITAL_COMMUNITY): Payer: Self-pay

## 2012-08-14 ENCOUNTER — Emergency Department (HOSPITAL_COMMUNITY)
Admission: EM | Admit: 2012-08-14 | Discharge: 2012-08-14 | Disposition: A | Discharge status: Home / Self Care | Payer: Medicare HMO | Source: Home / Self Care

## 2012-08-14 DIAGNOSIS — H6500 Acute serous otitis media, unspecified ear: Secondary | ICD-10-CM

## 2012-08-14 MED ORDER — AMOXICILLIN 500 MG PO CAPS: 1000.0000 mg | ORAL_CAPSULE | Freq: Two times a day (BID) | ORAL | Status: AC

## 2012-08-14 NOTE — ED Provider Notes (Signed)
History     CSN: [id number]  Arrival date & time 08/14/12  0841   First MD Initiated Contact with Patient 08/14/12 [phone]      Chief Complaint  Patient presents with  . Ear Fullness  . Facial Pain    (Consider location/radiation/quality/duration/timing/severity/associated sxs/prior treatment) Patient is a 66 y.o. female presenting with plugged ear sensation and ear pain.  Ear Fullness  Otalgia This is a new problem. The current episode started more than 1 week ago. There is pain in the left ear. The problem occurs constantly. The problem has been gradually worsening. There has been no fever. The pain is mild. Associated symptoms include rhinorrhea. Pertinent negatives include no ear discharge and no hearing loss.    Past Medical History  Diagnosis Date  . Hypertension   . Arthritis     shoulder    Past Surgical History  Procedure Date  . Abdominal hysterectomy [id number]  . Gallbladder surgery [id number]    Family History  Problem Relation Age of Onset  . Diabetes Sister   . Hypertension Sister   . Hypertension Brother   . Diabetes Sister   . Hypertension Sister   . Stroke Sister   . Heart disease Brother     History  Substance Use Topics  . Smoking status: Never Smoker   . Smokeless tobacco: Never Used  . Alcohol Use: 0.6 oz/week    1 Glasses of wine per week    OB History    Grav Para Term Preterm Abortions TAB SAB Ect Mult Living                  Review of Systems  Constitutional: Negative for chills and activity change.  HENT: Positive for ear pain and rhinorrhea. Negative for hearing loss and ear discharge.   Cardiovascular: Negative.   Gastrointestinal: Negative.   Musculoskeletal: Negative.   Skin: Negative.   Psychiatric/Behavioral: Negative.     Allergies  Hydrocodone  Home Medications   Current Outpatient Rx  Name Route Sig Dispense Refill  . ASPIRIN 81 MG PO TABS Oral Take 81 mg by mouth daily.      Marland Kitchen VITAMIN D 1000 UNITS PO TABS Oral  Take 1,000 Units by mouth daily.      Marland Kitchen VITAMIN B 12 PO Oral Take by mouth.      Marland Kitchen KLOR-CON M10 10 MEQ PO TBCR  TAKE ONE TABLET BY MOUTH EVERY DAY 30 each 11  . LISINOPRIL-HYDROCHLOROTHIAZIDE 20-25 MG PO TABS Oral Take 1 tablet by mouth daily. 30 tablet 11  . METOPROLOL TARTRATE 100 MG PO TABS Oral Take 1 tablet (100 mg total) by mouth 2 (two) times daily. 60 tablet 11  . AMOXICILLIN 500 MG PO CAPS Oral Take 2 capsules (1,000 mg total) by mouth 2 (two) times daily. 40 capsule 0  . POTASSIUM CHLORIDE 10 MEQ PO TBCR Oral Take 10 mEq by mouth daily.      Marland Kitchen SIMVASTATIN 10 MG PO TABS Oral Take 1 tablet (10 mg total) by mouth at bedtime. 30 tablet 11    BP 150/75  Pulse 96  Temp 98.2 F (36.8 C) (Oral)  Resp 18  SpO2 99%  Physical Exam  Constitutional: She is oriented to person, place, and time. She appears well-developed and well-nourished.  HENT:       L TM pale, irregular, landmarks obliterated  Eyes: Pupils are equal, round, and reactive to light.  Neck: Normal range of motion. Neck supple.  Cardiovascular: Normal rate and  regular rhythm.   Pulmonary/Chest: Effort normal and breath sounds normal. No respiratory distress.  Musculoskeletal: Normal range of motion.  Neurological: She is alert and oriented to person, place, and time.  Skin: Skin is warm and dry.    ED Course  Procedures (including critical care time)  Labs Reviewed - No data to display No results found.   1. Otitis media, serous, acute       MDM  Amoxil 500 2 caps bid x 10d. Sudafed pe 10mg  q 4-6h prn only . Saline nasal spray rpn. Local heat to hear.         Hayden Rasmussen, NP 08/14/12 [phone]

## 2012-08-14 NOTE — ED Notes (Signed)
C/o lt ear pain and sinus/head congestion on lt side of face.  Sx for 1 week.

## 2012-08-15 NOTE — ED Provider Notes (Signed)
Medical screening examination/treatment/procedure(s) were performed by resident physician or non-physician practitioner and as supervising physician I was immediately available for consultation/collaboration.   KINDL,JAMES DOUGLAS MD.    James D Kindl, MD 08/15/12 2129 

## 2012-12-15 ENCOUNTER — Ambulatory Visit (INDEPENDENT_AMBULATORY_CARE_PROVIDER_SITE_OTHER): Payer: Medicare HMO | Admitting: Internal Medicine

## 2012-12-15 ENCOUNTER — Encounter: Payer: Self-pay | Admitting: Internal Medicine

## 2012-12-15 VITALS — BP 140/82 | HR 72 | Temp 98.0°F | Wt 198.0 lb

## 2012-12-15 DIAGNOSIS — I10 Essential (primary) hypertension: Secondary | ICD-10-CM

## 2012-12-15 DIAGNOSIS — J329 Chronic sinusitis, unspecified: Secondary | ICD-10-CM

## 2012-12-15 DIAGNOSIS — J40 Bronchitis, not specified as acute or chronic: Secondary | ICD-10-CM

## 2012-12-15 NOTE — Patient Instructions (Addendum)
Take antibiotics as prescribed. Use cough tablets as needed. Take Sterapred DS 10 mg 6 day dosepak. Call if not better in 10 days

## 2012-12-15 NOTE — Progress Notes (Signed)
  Subjective:    Patient ID: Sharon Russell, female    DOB: 1946/08/31, 67 y.o.   MRN: [id number]  HPI Onset URI symptoms a couple of weeks ago with earache, no sore throat. No documented fever. No myalgias. Has malaise and faitgue.  Clear sputum production. Did not take influenza vaccine. Clear nasal drainage. Tried OTC antihistamine without much relief. No diarrhea and vomiting. Has scratchy throat and nasal congestion. A lot of nasal drainage.      Review of Systems     Objective:   Physical Exam  HENT:  Right Ear: External ear normal.  Left Ear: External ear normal.  Mouth/Throat: Oropharynx is clear and moist. No oropharyngeal exudate.       Boggy nasal mucosa.  Eyes: Right eye exhibits no discharge. Left eye exhibits no discharge.  Neck: Neck supple.  Pulmonary/Chest: Effort normal and breath sounds normal. No respiratory distress. She has no wheezes. She has no rales.  Lymphadenopathy:    She has no cervical adenopathy.  Skin: Skin is warm and dry. She is not diaphoretic.          Assessment & Plan:  Sinusitis  Bronchitis  Plan: Levaquin 500 milligrams daily for 7 days. Tessalon Perles 100 mg  Two  Perles 3 times daily as needed for cough. Sterapred DS 10 mg  6 day dose pack.

## 2013-01-08 ENCOUNTER — Other Ambulatory Visit: Payer: Medicare HMO | Admitting: Internal Medicine

## 2013-01-08 DIAGNOSIS — Z79899 Other long term (current) drug therapy: Secondary | ICD-10-CM

## 2013-01-08 DIAGNOSIS — R7301 Impaired fasting glucose: Secondary | ICD-10-CM

## 2013-01-08 DIAGNOSIS — E785 Hyperlipidemia, unspecified: Secondary | ICD-10-CM

## 2013-01-08 LAB — HEPATIC FUNCTION PANEL
ALT: 12 U/L (ref 0–35)
AST: 14 U/L (ref 0–37)
Bilirubin, Direct: 0.1 mg/dL (ref 0.0–0.3)
Indirect Bilirubin: 0.4 mg/dL (ref 0.0–0.9)

## 2013-01-08 LAB — LIPID PANEL
Cholesterol: 199 mg/dL (ref 0–200)
Total CHOL/HDL Ratio: 2.6 Ratio
Triglycerides: 58 mg/dL (ref <150)

## 2013-01-09 ENCOUNTER — Encounter: Payer: Self-pay | Admitting: Internal Medicine

## 2013-01-09 ENCOUNTER — Ambulatory Visit (INDEPENDENT_AMBULATORY_CARE_PROVIDER_SITE_OTHER): Payer: Medicare HMO | Admitting: Internal Medicine

## 2013-01-09 VITALS — BP 138/72 | HR 72 | Temp 98.2°F | Ht 61.75 in | Wt 196.0 lb

## 2013-01-09 DIAGNOSIS — E785 Hyperlipidemia, unspecified: Secondary | ICD-10-CM

## 2013-01-09 DIAGNOSIS — I10 Essential (primary) hypertension: Secondary | ICD-10-CM

## 2013-01-09 DIAGNOSIS — E119 Type 2 diabetes mellitus without complications: Secondary | ICD-10-CM

## 2013-01-09 NOTE — Progress Notes (Signed)
  Subjective:    Patient ID: Sharon Russell, female    DOB: 1946-02-11, 68 y.o.   MRN: [id number]  HPI  For 6 month follow up of HTN, hyperlipidemia and Type 2 Diabetes mellitus.  BP under good control. Pt. compliant with medication.  No complaints. Lipid panel markedly improved on statin medication. However he A1c has increased from 7% to 6.3%. This could be due to the Christmas holidays.    Review of Systems     Objective:   Physical Exam  Vitals reviewed. Constitutional: She appears well-developed and well-nourished. No distress.  HENT:  Head: Normocephalic and atraumatic.  Neck: Neck supple. No JVD present. No thyromegaly present.  Cardiovascular: Normal rate, regular rhythm and normal heart sounds.   No murmur heard. Pulmonary/Chest: Breath sounds normal. No respiratory distress. She has no wheezes. She has no rales.  Skin: Skin is warm and dry. She is not diaphoretic.  Psychiatric: She has a normal mood and affect. Her behavior is normal. Judgment and thought content normal.          Assessment & Plan:  Hypertension-stable  Hyperlipidemia-improved considerably on statin therapy. Total cholesterol has improved from 255 total to 153. LDL cholesterol has improved from 199-110   Type 2 diabetes mellitus diet controlled. Hemoglobin A1c has increased from 6.3% to 7%. Patient needs to watch her diet and exercise.  Plan: Return in 6 months.

## 2013-02-25 NOTE — Addendum Note (Signed)
Addended by: Margaree Mackintosh on: 02/25/2013 01:27 AM   Modules accepted: Level of Service

## 2013-02-25 NOTE — Patient Instructions (Addendum)
Watch diet and exercise. Hemoglobin A1c has increased. Continue statin medication. Return in 6 months. Am pleased with lipid results.

## 2013-05-01 ENCOUNTER — Encounter (HOSPITAL_COMMUNITY): Payer: Self-pay | Admitting: *Deleted

## 2013-05-01 ENCOUNTER — Emergency Department (INDEPENDENT_AMBULATORY_CARE_PROVIDER_SITE_OTHER): Payer: Medicare HMO

## 2013-05-01 ENCOUNTER — Emergency Department (HOSPITAL_COMMUNITY)
Admission: EM | Admit: 2013-05-01 | Discharge: 2013-05-01 | Disposition: A | Discharge status: Home / Self Care | Payer: Medicare HMO | Source: Home / Self Care | Attending: Emergency Medicine | Admitting: Emergency Medicine

## 2013-05-01 DIAGNOSIS — S83207A Unspecified tear of unspecified meniscus, current injury, left knee, initial encounter: Secondary | ICD-10-CM

## 2013-05-01 DIAGNOSIS — M1990 Unspecified osteoarthritis, unspecified site: Secondary | ICD-10-CM

## 2013-05-01 DIAGNOSIS — IMO0002 Reserved for concepts with insufficient information to code with codable children: Secondary | ICD-10-CM

## 2013-05-01 MED ORDER — OXYCODONE-ACETAMINOPHEN 5-325 MG PO TABS: ORAL_TABLET | ORAL | Status: DC

## 2013-05-01 MED ORDER — DICLOFENAC SODIUM 75 MG PO TBEC: 75.0000 mg | DELAYED_RELEASE_TABLET | Freq: Two times a day (BID) | ORAL | Status: DC

## 2013-05-01 MED ORDER — IBUPROFEN 800 MG PO TABS
ORAL_TABLET | ORAL | Status: AC
  Filled 2013-05-01: qty 1

## 2013-05-01 MED ORDER — IBUPROFEN 800 MG PO TABS
800.0000 mg | ORAL_TABLET | Freq: Once | ORAL | Status: AC
  Administered 2013-05-01: 800 mg via ORAL

## 2013-05-01 NOTE — ED Provider Notes (Signed)
Chief Complaint:   Chief Complaint  Patient presents with  . Knee Pain    History of Present Illness:   Sharon Russell is a 67 year old female who's had a one-week history of left knee pain. She denies any injury. The pain just started spontaneously. It radiates down to her foot. The pain is quite severe rated 8/10 in intensity. She thinks there is a little bit of swelling and has been using a cane. She feels the knee sometimes gives way and sometimes locks. It hurts to bear weight, to ambulate on a flat surface or going up and down steps. Also hurts to sit or even to lie down.  Review of Systems:  Other than noted above, the patient denies any of the following symptoms: Systemic:  No fevers, chills, sweats, or aches.  No fatigue or tiredness. Musculoskeletal:  No joint pain, arthritis, bursitis, swelling, back pain, or neck pain.  Neurological:  No muscular weakness, paresthesias, headache, or trouble with speech or coordination.  No dizziness.  PMFSH:  Past medical history, family history, social history, meds, and allergies were reviewed.  No prior history of knee pain or arthritis.  She has high blood pressure and takes a blood pressure medication.  Physical Exam:   Vital signs:  BP 177/89  Pulse 88  Temp(Src) 97.3 F (36.3 C) (Oral)  Resp 18  SpO2 100% Gen:  Alert and oriented times 3.  In no distress. Musculoskeletal: There is no swelling or joint effusion. There is very little pain to palpation externally, but on movement of the knee she's only able to flex a few degrees.   Unable to do McMurray sign, Lachman's sign, drawer test, or varus or valgus stress.  Otherwise, all joints had a full a ROM with no swelling, bruising or deformity.  No edema, pulses full. Extremities were warm and pink.  Capillary refill was brisk.  Skin:  Clear, warm and dry.  No rash. Neuro:  Alert and oriented times 3.  Muscle strength was normal.  Sensation was intact to light touch.   Radiology:  Dg Knee 2  Views Left  05/01/2013   *RADIOLOGY REPORT*  Clinical Data: Knee pain.  LEFT KNEE - 1-2 VIEW  Comparison: No priors.  Findings: AP and lateral views of the left knee demonstrate no acute displaced fracture, subluxation or dislocation.  There is joint space narrowing, subchondral sclerosis and osteophyte formation, most severe in the medial and patellofemoral compartments, compatible with osteoarthritis.  IMPRESSION: 1.  No acute radiographic abnormality left knee. 2.  Moderate degenerative changes of osteoarthritis, as above.   Original Report Authenticated By: Trudie Reed, M.D.   I reviewed the images independently and personally and concur with the radiologist's findings.   Course in Urgent Care Center:   Placed in a knee immobilizer and given ibuprofen 800 mg for the pain.  Assessment:  The primary encounter diagnosis was Osteoarthritis. A diagnosis of Meniscus tear, left, initial encounter was also pertinent to this visit.  X-ray shows osteoarthritis in the medial and patellar compartments. Obviously this is been going on for years and is not the cause of her acute pain. Most likely due to internal cartilage injury and will need followup with an orthopedist.  Plan:   1.  The following meds were prescribed:   Discharge Medication List as of 05/01/2013  2:15 PM    START taking these medications   Details  diclofenac (VOLTAREN) 75 MG EC tablet Take 1 tablet (75 mg total) by mouth 2 (  two) times daily., Starting 05/01/2013, Until Discontinued, Normal    oxyCODONE-acetaminophen (PERCOCET) 5-325 MG per tablet 1 to 2 tablets every 6 hours as needed for pain., Print       2.  The patient was instructed in symptomatic care, including rest and activity, elevation, application of ice and compression.  Appropriate handouts were given. 3.  The patient was told to return if becoming worse in any way, if no better in 3 or 4 days, and given some red flag symptoms such as worsening pain or neurological  change that would indicate earlier return.   4.  The patient was told to follow up with Dr. Dion Saucier as soon as possible.    Reuben Likes, MD 05/01/13 2013

## 2013-05-01 NOTE — ED Notes (Signed)
Pt  Reports   Pain  In the  Left  Knee   Which  Radiates   Downward  To  The  l  Leg           X  1  Week  She  denys  Any  specefic  Injury      She  Is  Able  To  Bear  Weight at this  Time

## 2013-05-02 NOTE — ED Notes (Signed)
Pt  Requests  Pain meds  Be  Changed  To  Motrin 800  Mg     Dr  Jolene Schimke  rx  Phoned  To  [facility]  [address]

## 2013-07-10 ENCOUNTER — Other Ambulatory Visit: Payer: Medicare HMO | Admitting: Internal Medicine

## 2013-07-10 DIAGNOSIS — R7301 Impaired fasting glucose: Secondary | ICD-10-CM

## 2013-07-10 DIAGNOSIS — Z130 Encounter for screening for diseases of the blood and blood-forming organs and certain disorders involving the immune mechanism: Secondary | ICD-10-CM

## 2013-07-10 DIAGNOSIS — E785 Hyperlipidemia, unspecified: Secondary | ICD-10-CM

## 2013-07-10 DIAGNOSIS — Z1329 Encounter for screening for other suspected endocrine disorder: Secondary | ICD-10-CM

## 2013-07-10 DIAGNOSIS — I10 Essential (primary) hypertension: Secondary | ICD-10-CM

## 2013-07-10 DIAGNOSIS — Z79899 Other long term (current) drug therapy: Secondary | ICD-10-CM

## 2013-07-10 LAB — COMPREHENSIVE METABOLIC PANEL
Albumin: 4.4 g/dL (ref 3.5–5.2)
Alkaline Phosphatase: 106 U/L (ref 39–117)
BUN: 22 mg/dL (ref 6–23)
Glucose, Bld: 111 mg/dL — ABNORMAL HIGH (ref 70–99)
Potassium: 4.2 mEq/L (ref 3.5–5.3)
Total Bilirubin: 0.6 mg/dL (ref 0.3–1.2)

## 2013-07-10 LAB — CBC WITH DIFFERENTIAL/PLATELET
Basophils Relative: 1 % (ref 0–1)
Eosinophils Absolute: 0.3 10*3/uL (ref 0.0–0.7)
Eosinophils Relative: 6 % — ABNORMAL HIGH (ref 0–5)
HCT: 36 % (ref 36.0–46.0)
Hemoglobin: 12.3 g/dL (ref 12.0–15.0)
MCH: 27.6 pg (ref 26.0–34.0)
MCHC: 34.2 g/dL (ref 30.0–36.0)
MCV: 80.9 fL (ref 78.0–100.0)
Monocytes Absolute: 0.5 10*3/uL (ref 0.1–1.0)
Monocytes Relative: 9 % (ref 3–12)

## 2013-07-10 LAB — LIPID PANEL
Cholesterol: 224 mg/dL — ABNORMAL HIGH (ref 0–200)
Triglycerides: 88 mg/dL (ref <150)
VLDL: 18 mg/dL (ref 0–40)

## 2013-07-10 LAB — HEMOGLOBIN A1C: Mean Plasma Glucose: 126 mg/dL — ABNORMAL HIGH (ref <117)

## 2013-07-12 ENCOUNTER — Ambulatory Visit (INDEPENDENT_AMBULATORY_CARE_PROVIDER_SITE_OTHER): Payer: Medicare HMO | Admitting: Internal Medicine

## 2013-07-12 ENCOUNTER — Encounter: Payer: Self-pay | Admitting: Internal Medicine

## 2013-07-12 VITALS — BP 136/76 | HR 68 | Ht 61.5 in | Wt 198.0 lb

## 2013-07-12 DIAGNOSIS — M1712 Unilateral primary osteoarthritis, left knee: Secondary | ICD-10-CM

## 2013-07-12 DIAGNOSIS — R7302 Impaired glucose tolerance (oral): Secondary | ICD-10-CM

## 2013-07-12 DIAGNOSIS — I10 Essential (primary) hypertension: Secondary | ICD-10-CM

## 2013-07-12 DIAGNOSIS — M1710 Unilateral primary osteoarthritis, unspecified knee: Secondary | ICD-10-CM

## 2013-07-12 DIAGNOSIS — M1711 Unilateral primary osteoarthritis, right knee: Secondary | ICD-10-CM

## 2013-07-12 DIAGNOSIS — Z0000 Encounter for general adult medical examination without abnormal findings: Secondary | ICD-10-CM

## 2013-07-12 DIAGNOSIS — E785 Hyperlipidemia, unspecified: Secondary | ICD-10-CM

## 2013-07-12 LAB — POCT URINALYSIS DIPSTICK
Bilirubin, UA: NEGATIVE
Blood, UA: NEGATIVE
Glucose, UA: NEGATIVE
Leukocytes, UA: NEGATIVE
Nitrite, UA: NEGATIVE

## 2013-07-12 MED ORDER — METHYLPREDNISOLONE ACETATE 80 MG/ML IJ SUSP: 80.0000 mg | Freq: Once | INTRAMUSCULAR | Status: DC

## 2013-07-12 MED ORDER — MELOXICAM 15 MG PO TABS: 15.0000 mg | ORAL_TABLET | Freq: Every day | ORAL | Status: DC

## 2013-07-12 NOTE — Progress Notes (Signed)
Subjective:    Patient ID: Sharon Russell, female    DOB: 08/22/1946, 67 y.o.   MRN: [id number]  HPI 67 year old Black female in today for health maintenance and evaluation of medical problems including hypertension, hyperlipidemia and impaired glucose tolerance. She is on Zocor, metoprolol, lisinopril/HCTZ. Takes potassium supplementation. Glucose intolerance his diet controlled.  Patient first presented to this office July 2012. Was noted to have had hypertension for 7 years prior to that.  History of abdominal hysterectomy without oophorectomy 1980 for fibroids. History of cholecystectomy in 2009. Had colonoscopy in 2011 by Dr. Juanda Chance.  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 Husband is retired. Never smoked. Social alcohol consumption. 3 adult female children.  Family history: Father died of apparent cardiac arrest with history of pacemaker and 8 years of age. Mother died at age 77 in her sleep. Total of 3 brothers-one died of complications of alcoholism with history of seizure disorder, another brother died at age 54 with an MI. Patient has 3 sisters-one of whom died of a stroke and 2 sisters are living. Mother had history of diabetes as does one sister. Mother had hypertension. One brother and one sister have hypertensio    Review of Systems  Musculoskeletal:       Significant pain in left knee.       Objective:   Physical Exam  Vitals reviewed. Constitutional: She is oriented to person, place, and time. She appears well-developed and well-nourished. No distress.  HENT:  Head: Normocephalic and atraumatic.  Right Ear: External ear normal.  Left Ear: External ear normal.  Mouth/Throat: Oropharynx is clear and moist. No oropharyngeal exudate.  Eyes: Conjunctivae and EOM are normal. Pupils are equal, round, and reactive to light. Right eye exhibits no  discharge. Left eye exhibits no discharge. No scleral icterus.  Neck: Neck supple. No JVD present. No thyromegaly present.  Cardiovascular: Normal rate, regular rhythm, normal heart sounds and intact distal pulses.   Pulmonary/Chest: Effort normal and breath sounds normal. She has no wheezes.  Breasts normal female  Abdominal: Soft. Bowel sounds are normal.  Genitourinary:  Pap done 2012. Bimanual normal. Has had hysterectomy   Musculoskeletal:  Crepitus in left knee. Slight swelling.  Neurological: She is alert and oriented to person, place, and time. She has normal reflexes. No cranial nerve deficit.  Skin: Skin is warm and dry. No rash noted. She is not diaphoretic.  Psychiatric: She has a normal mood and affect. Her behavior is normal. Thought content normal.          Assessment & Plan:  Hypertension-stable  Obesity-encouraged diet and exercise  Hyperlipidemia-stable on statin  Impaired glucose tolerance-treated with diet-stable  Osteoarthritis left knee. After sterile prep and drape and local anesthesia with 1% Xylocaine. Left knee was injected with Depo-Medrol, Marcaine and Xylocaine. She will be placed on Mobic 15 mg daily. If knee does not improve, she will need to see orthopedist.  Plan: Return in 6 months for office visit, hemoglobin A1c, fasting lipid panel liver functions.       Subjective:   Patient presents for Medicare Annual/Subsequent preventive examination.   Review Past Medical/Family/Social: See above   Risk Factors  Current exercise habits: walks some Dietary issues discussed: low fat low carb  Cardiac risk factors: HTN/ hyperlipidemia, impaired glucose intolerance  Depression Screen  (Note: if answer to either of the following is "  Yes", a more complete depression screening is indicated)   Over the past two weeks, have you felt down, depressed or hopeless? No  Over the past two weeks, have you felt little interest or pleasure in doing things?  No Have you lost interest or pleasure in daily life? No Do you often feel hopeless? No Do you cry easily over simple problems? No   Activities of Daily Living  In your present state of health, do you have any difficulty performing the following activities?:   Driving? No  Managing money? No  Feeding yourself? No  Getting from bed to chair? No  Climbing a flight of stairs? No sometimes knee hurts going up stairs Preparing food and eating?: No  Bathing or showering? No  Getting dressed: No  Getting to the toilet? No  Using the toilet:No  Moving around from place to place: No  In the past year have you fallen or had a near fall?:No  Are you sexually active? No  Do you have more than one partner? No   Hearing Difficulties: No  Do you often ask people to speak up or repeat themselves?  sometimes Do you experience ringing or noises in your ears? No  Do you have difficulty understanding soft or whispered voices? No  Do you feel that you have a problem with memory? No Do you often misplace items? No    Home Safety:  Do you have a smoke alarm at your residence? Yes Do you have grab bars in the bathroom? yes Do you have throw rugs in your house? Have pads under them so nonslip.   Cognitive Testing  Alert? Yes Normal Appearance?Yes  Oriented to person? Yes Place? Yes  Time? Yes  Recall of three objects? Yes  Can perform simple calculations? Yes  Displays appropriate judgment?Yes  Can read the correct time from a watch face?Yes   List the Names of Other Physician/Practitioners you currently use:  See referral list for the physicians patient is currently seeing.  Just Dr. Lenord Fellers    Review of Systems:   Objective:     General appearance: Appears stated age and mildly obese  Head: Normocephalic, without obvious abnormality, atraumatic  Eyes: conj clear, EOMi PEERLA  Ears: normal TM's and external ear canals both ears  Nose: Nares normal. Septum midline. Mucosa normal. No  drainage or sinus tenderness.  Throat: lips, mucosa, and tongue normal; teeth and gums normal  Neck: no adenopathy, no carotid bruit, no JVD, supple, symmetrical, trachea midline and thyroid not enlarged, symmetric, no tenderness/mass/nodules  No CVA tenderness.  Lungs: clear to auscultation bilaterally  Breasts: normal appearance, no masses or tenderness. Heart: regular rate and rhythm, S1, S2 normal, no murmur, click, rub or gallop  Abdomen: soft, non-tender; bowel sounds normal; no masses, no organomegaly  Musculoskeletal: ROM normal in all joints, no crepitus, no deformity, Normal muscle strengthen. Back  is symmetric, no curvature. Skin: Skin color, texture, turgor normal. No rashes or lesions  Lymph nodes: Cervical, supraclavicular, and axillary nodes normal.  Neurologic: CN 2 -12 Normal, Normal symmetric reflexes. Normal coordination and gait  Psych: Alert & Oriented x 3, Mood appear stable.    Assessment:    Annual wellness medicare exam   Plan:    During the course of the visit the patient was educated and counseled about appropriate screening and preventive services including:   Annual mammogram     Patient Instructions (the written plan) was given to the patient.  Medicare Attestation  I  have personally reviewed:  The patient's medical and social history  Their use of alcohol, tobacco or illicit drugs  Their current medications and supplements  The patient's functional ability including ADLs,fall risks, home safety risks, cognitive, and hearing and visual impairment  Diet and physical activities  Evidence for depression or mood disorders  The patient's weight, height, BMI, and visual acuity have been recorded in the chart. I have made referrals, counseling, and provided education to the patient based on review of the above and I have provided the patient with a written personalized care plan for preventive services.

## 2013-07-14 ENCOUNTER — Encounter: Payer: Self-pay | Admitting: Internal Medicine

## 2013-07-14 NOTE — Patient Instructions (Addendum)
Continue same medications and return in 6 months. Take Mobic 15 mg daily for osteoarthritis of knees. Left knee has been injected today. If not better in 2 weeks, we will refer you to orthopedist.

## 2013-07-23 ENCOUNTER — Other Ambulatory Visit: Payer: Self-pay | Admitting: Internal Medicine

## 2013-08-23 ENCOUNTER — Other Ambulatory Visit: Payer: Self-pay | Admitting: Internal Medicine

## 2013-09-24 ENCOUNTER — Other Ambulatory Visit: Payer: Self-pay | Admitting: Internal Medicine

## 2014-01-12 ENCOUNTER — Other Ambulatory Visit: Payer: Self-pay | Admitting: Internal Medicine

## 2014-01-22 ENCOUNTER — Other Ambulatory Visit: Payer: Commercial Managed Care - HMO | Admitting: Internal Medicine

## 2014-01-22 DIAGNOSIS — R7301 Impaired fasting glucose: Secondary | ICD-10-CM

## 2014-01-22 DIAGNOSIS — Z79899 Other long term (current) drug therapy: Secondary | ICD-10-CM

## 2014-01-22 DIAGNOSIS — E785 Hyperlipidemia, unspecified: Secondary | ICD-10-CM

## 2014-01-22 LAB — LIPID PANEL
CHOL/HDL RATIO: 2.2 ratio
CHOLESTEROL: 206 mg/dL — AB (ref 0–200)
HDL: 94 mg/dL (ref >39)
LDL Cholesterol: 98 mg/dL (ref 0–99)
Triglycerides: 71 mg/dL (ref <150)
VLDL: 14 mg/dL (ref 0–40)

## 2014-01-22 LAB — HEMOGLOBIN A1C
HEMOGLOBIN A1C: 6.2 % — AB (ref <5.7)
MEAN PLASMA GLUCOSE: 131 mg/dL — AB (ref <117)

## 2014-01-22 LAB — HEPATIC FUNCTION PANEL
ALT: 13 U/L (ref 0–35)
AST: 17 U/L (ref 0–37)
Albumin: 4.3 g/dL (ref 3.5–5.2)
Alkaline Phosphatase: 96 U/L (ref 39–117)
BILIRUBIN DIRECT: 0.1 mg/dL (ref 0.0–0.3)
BILIRUBIN INDIRECT: 0.5 mg/dL (ref 0.2–1.2)
BILIRUBIN TOTAL: 0.6 mg/dL (ref 0.2–1.2)
Total Protein: 6.5 g/dL (ref 6.0–8.3)

## 2014-01-24 ENCOUNTER — Ambulatory Visit (INDEPENDENT_AMBULATORY_CARE_PROVIDER_SITE_OTHER): Payer: Medicare HMO | Admitting: Internal Medicine

## 2014-01-24 ENCOUNTER — Encounter: Payer: Self-pay | Admitting: Internal Medicine

## 2014-01-24 VITALS — BP 168/84 | HR 64 | Temp 98.6°F | Wt 202.0 lb

## 2014-01-24 DIAGNOSIS — E119 Type 2 diabetes mellitus without complications: Secondary | ICD-10-CM

## 2014-01-24 DIAGNOSIS — I10 Essential (primary) hypertension: Secondary | ICD-10-CM

## 2014-01-24 DIAGNOSIS — E785 Hyperlipidemia, unspecified: Secondary | ICD-10-CM

## 2014-01-24 NOTE — Progress Notes (Signed)
   Subjective:    Patient ID: Sharon Russell, female    DOB: 09/14/1946, [age] y.o.   MRN: [id number]  HPI In today for six-month recheck on hyperlipidemia, hypertension, osteoarthritis of knee for which she is now taking Mobic with good relief, and controlled type 2 diabetes. Hemoglobin A1c is 6.2% and stable. Lipid panel liver functions are also stable on statin medication. However her blood pressure is significantly elevated today at 168/84. This was repeated and same result was obtained. 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 Says she took all of her antihypertensive medication at 5 AM this morning. Him a bit concerned about the elevation. She says at home it runs fairly low. She's got a call me in a week with some readings. She recently joined a gym. She needs to lose weight.    Review of Systems     Objective:   Physical Exam neck is supple without JVD thyromegaly or carotid bruits. Chest clear to auscultation. Cardiac exam regular rate and rhythm normal S1 and S2. Extremities without pitting edema. Skin is warm and dry. Weight is 202 pounds. Previous weight 198 pounds.        Assessment & Plan:  Hypertension-elevated readings today etiology unclear. Is to call in one week with several blood pressure readings  Hyperlipidemia-stable on statin medication  Osteoarthritis of knees stable on Mobic and improved  Obesity-needs to diet exercise and lose weight   Controlled type 2 diabetes-hemoglobin A1c stable  Plan: Due for physical examination early August 2015. Call with blood pressure readings next week.

## 2014-01-24 NOTE — Patient Instructions (Signed)
Call with several blood pressure readings next week. Diet exercise and lose weight. Continue same medications for now.

## 2014-02-27 ENCOUNTER — Other Ambulatory Visit: Payer: Self-pay | Admitting: Internal Medicine

## 2014-03-06 ENCOUNTER — Other Ambulatory Visit: Payer: Self-pay | Admitting: Internal Medicine

## 2014-05-07 ENCOUNTER — Other Ambulatory Visit: Payer: Self-pay | Admitting: Internal Medicine

## 2014-05-08 ENCOUNTER — Encounter: Payer: Self-pay | Admitting: Internal Medicine

## 2014-06-10 ENCOUNTER — Other Ambulatory Visit: Payer: Self-pay

## 2014-06-10 MED ORDER — POTASSIUM CHLORIDE ER 10 MEQ PO TBCR: 10.0000 meq | EXTENDED_RELEASE_TABLET | Freq: Every day | ORAL | Status: DC

## 2014-07-15 ENCOUNTER — Other Ambulatory Visit: Payer: Commercial Managed Care - HMO | Admitting: Internal Medicine

## 2014-07-15 DIAGNOSIS — I10 Essential (primary) hypertension: Secondary | ICD-10-CM

## 2014-07-15 DIAGNOSIS — E785 Hyperlipidemia, unspecified: Secondary | ICD-10-CM

## 2014-07-15 DIAGNOSIS — R7301 Impaired fasting glucose: Secondary | ICD-10-CM

## 2014-07-15 DIAGNOSIS — Z1329 Encounter for screening for other suspected endocrine disorder: Secondary | ICD-10-CM

## 2014-07-15 DIAGNOSIS — Z79899 Other long term (current) drug therapy: Secondary | ICD-10-CM

## 2014-07-15 DIAGNOSIS — Z130 Encounter for screening for diseases of the blood and blood-forming organs and certain disorders involving the immune mechanism: Secondary | ICD-10-CM

## 2014-07-15 LAB — CBC WITH DIFFERENTIAL/PLATELET
BASOS PCT: 1 % (ref 0–1)
Basophils Absolute: 0.1 10*3/uL (ref 0.0–0.1)
Eosinophils Absolute: 0.4 10*3/uL (ref 0.0–0.7)
Eosinophils Relative: 6 % — ABNORMAL HIGH (ref 0–5)
HEMATOCRIT: 35.6 % — AB (ref 36.0–46.0)
HEMOGLOBIN: 12.4 g/dL (ref 12.0–15.0)
LYMPHS ABS: 3.3 10*3/uL (ref 0.7–4.0)
LYMPHS PCT: 51 % — AB (ref 12–46)
MCH: 27.5 pg (ref 26.0–34.0)
MCHC: 34.8 g/dL (ref 30.0–36.0)
MCV: 78.9 fL (ref 78.0–100.0)
MONO ABS: 0.5 10*3/uL (ref 0.1–1.0)
MONOS PCT: 8 % (ref 3–12)
NEUTROS ABS: 2.2 10*3/uL (ref 1.7–7.7)
NEUTROS PCT: 34 % — AB (ref 43–77)
Platelets: 233 10*3/uL (ref 150–400)
RBC: 4.51 MIL/uL (ref 3.87–5.11)
RDW: 14.2 % (ref 11.5–15.5)
WBC: 6.5 10*3/uL (ref 4.0–10.5)

## 2014-07-15 LAB — HEMOGLOBIN A1C
Hgb A1c MFr Bld: 6.6 % — ABNORMAL HIGH (ref <5.7)
MEAN PLASMA GLUCOSE: 143 mg/dL — AB (ref <117)

## 2014-07-16 ENCOUNTER — Ambulatory Visit (INDEPENDENT_AMBULATORY_CARE_PROVIDER_SITE_OTHER): Payer: Commercial Managed Care - HMO | Admitting: Internal Medicine

## 2014-07-16 ENCOUNTER — Encounter: Payer: Self-pay | Admitting: Internal Medicine

## 2014-07-16 VITALS — BP 136/74 | HR 76 | Ht 61.0 in | Wt 201.0 lb

## 2014-07-16 DIAGNOSIS — E119 Type 2 diabetes mellitus without complications: Secondary | ICD-10-CM

## 2014-07-16 DIAGNOSIS — Z0000 Encounter for general adult medical examination without abnormal findings: Secondary | ICD-10-CM

## 2014-07-16 DIAGNOSIS — I10 Essential (primary) hypertension: Secondary | ICD-10-CM

## 2014-07-16 DIAGNOSIS — M170 Bilateral primary osteoarthritis of knee: Secondary | ICD-10-CM

## 2014-07-16 DIAGNOSIS — M1710 Unilateral primary osteoarthritis, unspecified knee: Secondary | ICD-10-CM

## 2014-07-16 DIAGNOSIS — R7301 Impaired fasting glucose: Secondary | ICD-10-CM

## 2014-07-16 DIAGNOSIS — E785 Hyperlipidemia, unspecified: Secondary | ICD-10-CM

## 2014-07-16 LAB — POCT URINALYSIS DIPSTICK
BILIRUBIN UA: NEGATIVE
Blood, UA: NEGATIVE
Glucose, UA: NEGATIVE
Ketones, UA: NEGATIVE
LEUKOCYTES UA: NEGATIVE
NITRITE UA: NEGATIVE
PH UA: 5.5
PROTEIN UA: NEGATIVE
Spec Grav, UA: 1.015
Urobilinogen, UA: NEGATIVE

## 2014-07-16 LAB — COMPREHENSIVE METABOLIC PANEL
ALBUMIN: 4.4 g/dL (ref 3.5–5.2)
ALK PHOS: 93 U/L (ref 39–117)
ALT: 14 U/L (ref 0–35)
AST: 16 U/L (ref 0–37)
BUN: 16 mg/dL (ref 6–23)
CALCIUM: 9.7 mg/dL (ref 8.4–10.5)
CHLORIDE: 104 meq/L (ref 96–112)
CO2: 23 mEq/L (ref 19–32)
Creat: 0.93 mg/dL (ref 0.50–1.10)
GLUCOSE: 121 mg/dL — AB (ref 70–99)
POTASSIUM: 4 meq/L (ref 3.5–5.3)
SODIUM: 140 meq/L (ref 135–145)
TOTAL PROTEIN: 6.8 g/dL (ref 6.0–8.3)
Total Bilirubin: 0.5 mg/dL (ref 0.2–1.2)

## 2014-07-16 LAB — LIPID PANEL
Cholesterol: 201 mg/dL — ABNORMAL HIGH (ref 0–200)
HDL: 86 mg/dL (ref >39)
LDL CALC: 97 mg/dL (ref 0–99)
TRIGLYCERIDES: 90 mg/dL (ref <150)
Total CHOL/HDL Ratio: 2.3 Ratio
VLDL: 18 mg/dL (ref 0–40)

## 2014-07-16 LAB — TSH: TSH: 1.829 u[IU]/mL (ref 0.350–4.500)

## 2014-07-16 NOTE — Patient Instructions (Addendum)
Return in 6 months. Continue same meds. Watch diet and exercise. Purchase Accu-Chek machine and check glucose once daily. Patient does not want to be on medication for diabetes. We'll reevaluate in 6 months.

## 2014-07-16 NOTE — Progress Notes (Signed)
Subjective:    Patient ID: Sharon Russell, female    DOB: Jun 16, 1946, 68 y.o.   MRN: [id number]  HPI 68 year old Female for Medicare wellness visit and evaluation of medical issues. No new complaints. Needs eye exam. No current eye physician and no recent eye exam.  Never had bone density study. Order given to have done at [facility].  Patient first presented to this office July 2012. Was noted to have had hypertension for 7 years prior to that. She has a history of hypertension, hyperlipidemia, impaired glucose tolerance. She is on Zocor, metoprolol, lisinopril/HCTZ and take potassium supplementation. Glucose intolerance is diet controlled. History of abdominal hysterectomy without oophorectomy 1980 for fibroids. Had Pap smear 2012 which was normal of vaginal cuff. History of cholecystectomy in 2009. Colonoscopy done in 2010 by Dr. Juanda Chance. No polyps noted. 10 year followup recommended according to her note. Osteoarthritis of  Knees.  Tetanus immunization given July 2012.  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 Husband is retired. Patient never smoke. Social alcohol consumption. 3 adult female children.   Family history: Sister age 44 just diagnosed with breast cancer. Father died of apparent cardiac arrest with history of pacemaker at 4 years of age mother died at age 16 in her sleep. Total of 3 brothers-one died of complications of alcoholism with history of seizure disorder, another brother died at age 62 with an MI. One sister died of a stroke. 2 sisters living. One brother and one sister with hypertension.  Review of Systems  Constitutional: Negative.   HENT: Negative.   Eyes: Negative.   Respiratory: Negative.   Cardiovascular: Negative.   Gastrointestinal: Negative.   Endocrine: Negative.   Genitourinary: Negative.   Allergic/Immunologic: Positive for environmental allergies.  Neurological:  Negative.   Hematological: Negative.   Psychiatric/Behavioral: Negative.        Objective:   Physical Exam  Vitals reviewed. Constitutional: She is oriented to person, place, and time. She appears well-developed and well-nourished. No distress.  HENT:  Head: Normocephalic and atraumatic.  Left Ear: External ear normal.  Mouth/Throat: Oropharynx is clear and moist. No oropharyngeal exudate.  Eyes: Conjunctivae and EOM are normal. Pupils are equal, round, and reactive to light. Right eye exhibits no discharge. Left eye exhibits no discharge. No scleral icterus.  Neck: Neck supple. No JVD present. No thyromegaly present.  Cardiovascular: Normal rate, regular rhythm, normal heart sounds and intact distal pulses.   No murmur heard. Pulmonary/Chest: Effort normal and breath sounds normal. No respiratory distress. She has no rales. She exhibits no tenderness.  Breasts normal female. Fibrocystic changes left breast upper outer quadrant  Abdominal: Soft. Bowel sounds are normal. She exhibits no distension and no mass. There is no tenderness. There is no rebound and no guarding.  Genitourinary:  Pap done 2012 of vaginal cuff which was normal. Bimanual normal today.  Musculoskeletal: Normal range of motion. She exhibits no edema.  Lymphadenopathy:    She has no cervical adenopathy.  Neurological: She is alert and oriented to person, place, and time. She has normal reflexes. She displays normal reflexes. No cranial nerve deficit. Coordination normal.  Skin: Skin is warm and dry. No rash noted. She is not diaphoretic.  Psychiatric: She has a normal mood and affect. Her behavior is normal. Judgment and thought content normal.   See  diabetic foot exam       Assessment & Plan:  Type 2 diabetes mellitus control with diet. Hemoglobin A1c is increased to 6.6% from 6.2% 6 months ago. Order given for her to purchase home glucose monitor and check Accu-Cheks once daily. She's been using husband's  machine. Counsel regarding diet exercise and weight loss. Patient does not want to be on medication for diabetes. Reevaluate in 6 months  Hypertension-stable current regimen  Hyperlipidemia-stable with statin therapy  Osteoarthritis of both knees  Plan: Needs annual diabetic eye exam. Never had bone density study. [facility]. Recently had mammogram.      Subjective:   Patient presents for Medicare Annual/Subsequent preventive examination.  Review Past Medical/Family/Social: see above   Risk Factors  Current exercise habits: walk at [facility] and treadmill 2 days a week Dietary issues discussed: low fat low carb  Cardiac risk factors:HTN Hyperlipidemia Glucose intolerance  Depression Screen  (Note: if answer to either of the following is "Yes", a more complete depression screening is indicated)   Over the past two weeks, have you felt down, depressed or hopeless? No  Over the past two weeks, have you felt little interest or pleasure in doing things? No Have you lost interest or pleasure in daily life? No Do you often feel hopeless? No Do you cry easily over simple problems? No   Activities of Daily Living  In your present state of health, do you have any difficulty performing the following activities?:   Driving? No  Managing money? No  Feeding yourself? No  Getting from bed to chair? No  Climbing a flight of stairs? No  Preparing food and eating?: No  Bathing or showering? No  Getting dressed: No  Getting to the toilet? No  Using the toilet:No  Moving around from place to place: No  In the past year have you fallen or had a near fall?:No  Are you sexually active? No  Do you have more than one partner? No   Hearing Difficulties: No  Do you often ask people to speak up or repeat themselves? No  Do you experience ringing or noises in your ears? No  Do you have difficulty understanding soft or whispered voices? sometimes Do you feel that you have a  problem with memory? No Do you often misplace items? No    Home Safety:  Do you have a smoke alarm at your residence? Yes Do you have grab bars in the bathroom? Do you have throw rugs in your house?   Cognitive Testing  Alert? Yes Normal Appearance?Yes  Oriented to person? Yes Place? Yes  Time? Yes  Recall of three objects? Yes  Can perform simple calculations? Yes  Displays appropriate judgment?Yes  Can read the correct time from a watch face?Yes   List the Names of Other Physician/Practitioners you currently use:  See referral list for the physicians patient is currently seeing.  Dr. Juanda ChanceBrodie   Review of Systems:   Objective:     General appearance: Appears stated age and mildly obese  Head: Normocephalic, without obvious abnormality, atraumatic  Eyes: conj clear, EOMi PEERLA  Ears: normal TM's and external ear canals both ears  Nose: Nares normal. Septum midline. Mucosa normal. No drainage or sinus tenderness.  Throat: lips, mucosa, and tongue normal; teeth and gums normal  Neck: no adenopathy, no carotid bruit, no JVD, supple, symmetrical, trachea midline and thyroid not enlarged, symmetric, no tenderness/mass/nodules  No CVA tenderness.  Lungs: clear to auscultation bilaterally  Breasts: normal appearance, no masses or tenderness Heart: regular  rate and rhythm, S1, S2 normal, no murmur, click, rub or gallop  Abdomen: soft, non-tender; bowel sounds normal; no masses, no organomegaly  Musculoskeletal: ROM normal in all joints, no crepitus, no deformity, Normal muscle strengthen. Back  is symmetric, no curvature. Skin: Skin color, texture, turgor normal. No rashes or lesions  Lymph nodes: Cervical, supraclavicular, and axillary nodes normal.  Neurologic: CN 2 -12 Normal, Normal symmetric reflexes. Normal coordination and gait  Psych: Alert & Oriented x 3, Mood appear stable.    Assessment:    Annual wellness medicare exam   Plan:    During the course of the  visit the patient was educated and counseled about appropriate screening and preventive services including:   Annual mammogram Colonoscopy-done 2010 Dr. Juanda Chance no polyps Order for bone density screening Needs annual eye exam     Patient Instructions (the written plan) was given to the patient.  Medicare Attestation  I have personally reviewed:  The patient's medical and social history  Their use of alcohol, tobacco or illicit drugs  Their current medications and supplements  The patient's functional ability including ADLs,fall risks, home safety risks, cognitive, and hearing and visual impairment  Diet and physical activities  Evidence for depression or mood disorders  The patient's weight, height, BMI, and visual acuity have been recorded in the chart. I have made referrals, counseling, and provided education to the patient based on review of the above and I have provided the patient with a written personalized care plan for preventive services.

## 2014-07-17 LAB — MICROALBUMIN, URINE: Microalb, Ur: 0.96 mg/dL (ref 0.00–1.89)

## 2014-09-16 ENCOUNTER — Other Ambulatory Visit: Payer: Self-pay

## 2014-09-16 MED ORDER — LISINOPRIL-HYDROCHLOROTHIAZIDE 20-25 MG PO TABS: ORAL_TABLET | ORAL | Status: DC

## 2014-09-16 MED ORDER — SIMVASTATIN 10 MG PO TABS: ORAL_TABLET | ORAL | Status: DC

## 2014-10-07 ENCOUNTER — Encounter: Payer: Self-pay | Admitting: Internal Medicine

## 2014-10-31 ENCOUNTER — Telehealth: Payer: Self-pay

## 2014-10-31 NOTE — Telephone Encounter (Signed)
Left message reminding patient to have her eye exam before the end of the year.

## 2014-11-18 ENCOUNTER — Telehealth: Payer: Self-pay

## 2014-11-18 NOTE — Telephone Encounter (Signed)
Left message for patient to call office to let us know if/when they received flu vaccine. 

## 2015-01-20 ENCOUNTER — Other Ambulatory Visit: Payer: Commercial Managed Care - HMO | Admitting: Internal Medicine

## 2015-01-20 DIAGNOSIS — E785 Hyperlipidemia, unspecified: Secondary | ICD-10-CM

## 2015-01-20 DIAGNOSIS — E119 Type 2 diabetes mellitus without complications: Secondary | ICD-10-CM | POA: Diagnosis not present

## 2015-01-20 DIAGNOSIS — Z79899 Other long term (current) drug therapy: Secondary | ICD-10-CM | POA: Diagnosis not present

## 2015-01-20 LAB — HEPATIC FUNCTION PANEL
ALT: 12 U/L (ref 0–35)
AST: 15 U/L (ref 0–37)
Albumin: 4.3 g/dL (ref 3.5–5.2)
Alkaline Phosphatase: 93 U/L (ref 39–117)
BILIRUBIN INDIRECT: 0.6 mg/dL (ref 0.2–1.2)
BILIRUBIN TOTAL: 0.7 mg/dL (ref 0.2–1.2)
Bilirubin, Direct: 0.1 mg/dL (ref 0.0–0.3)
TOTAL PROTEIN: 6.9 g/dL (ref 6.0–8.3)

## 2015-01-20 LAB — LIPID PANEL
CHOL/HDL RATIO: 2.2 ratio
CHOLESTEROL: 196 mg/dL (ref 0–200)
HDL: 89 mg/dL (ref >39)
LDL CALC: 90 mg/dL (ref 0–99)
Triglycerides: 85 mg/dL (ref <150)
VLDL: 17 mg/dL (ref 0–40)

## 2015-01-20 LAB — HEMOGLOBIN A1C
Hgb A1c MFr Bld: 6.3 % — ABNORMAL HIGH (ref <5.7)
Mean Plasma Glucose: 134 mg/dL — ABNORMAL HIGH (ref <117)

## 2015-01-21 ENCOUNTER — Encounter: Payer: Self-pay | Admitting: Internal Medicine

## 2015-01-21 ENCOUNTER — Ambulatory Visit (INDEPENDENT_AMBULATORY_CARE_PROVIDER_SITE_OTHER): Payer: Commercial Managed Care - HMO | Admitting: Internal Medicine

## 2015-01-21 VITALS — BP 120/80 | HR 68 | Temp 97.4°F | Wt 203.0 lb

## 2015-01-21 DIAGNOSIS — Z23 Encounter for immunization: Secondary | ICD-10-CM

## 2015-01-21 DIAGNOSIS — E669 Obesity, unspecified: Secondary | ICD-10-CM

## 2015-01-21 DIAGNOSIS — I10 Essential (primary) hypertension: Secondary | ICD-10-CM

## 2015-01-21 DIAGNOSIS — E8881 Metabolic syndrome: Secondary | ICD-10-CM | POA: Diagnosis not present

## 2015-01-21 DIAGNOSIS — E119 Type 2 diabetes mellitus without complications: Secondary | ICD-10-CM | POA: Diagnosis not present

## 2015-01-21 DIAGNOSIS — E785 Hyperlipidemia, unspecified: Secondary | ICD-10-CM

## 2015-01-21 NOTE — Progress Notes (Signed)
   Subjective:    Patient ID: Sharon Russell, female    DOB: 08/23/1946, 69 y.o.   MRN: [id number]  HPI  69 year old Black Female for 6 month recheck. [facility] at [address]. Patient was apprised that she has a diagnosis of diabetes. She knew she had glucose issues but felt that she had diabetes she will be taking medication. Explained to her that diabetes was diet-controlled. She needs to diet exercise and lose weight. Has some trouble with her knee from time to time for which she takes meloxicam. Has quit taking 81 mg of aspirin daily but should resume that. Her sister recently had mastectomy for breast cancer. Did not have to have chemotherapy just radiation treatment. Doing well but she's been spending time helping her.    Review of Systems     Objective:   Physical Exam Skin warm and dry. Nodes none. Neck is supple without JVD thyromegaly or carotid bruits. Chest clear to auscultation cardiac exam regular rate and rhythm normal S1 and S2. Extremities without edema.       Assessment & Plan:  I am pleased with her test results. Lipid panel liver functions are normal. Hemoglobin A1c has decreased from 6.6-6.3% with diet and exercise alone. I would like for her to weigh less than 200 pounds  Hyperlipidemia-stable on statin medication  Hypertension-stable  Controlled type 2 diabetes mellitus  Osteoarthritis of knee  Metabolic syndrome  Obesity  Plan: Referral for eye appointment February 17. Prevnar given. Continue same medications. Encouraged diet exercise and weight loss and return in 6 months for physical exam.

## 2015-01-21 NOTE — Patient Instructions (Signed)
Prevnar given. Diet exercise and weight loss. Keep appointment for February 17. Return in 6 months for physical exam.

## 2015-01-22 LAB — MICROALBUMIN / CREATININE URINE RATIO
Creatinine, Urine: 25.5 mg/dL
Microalb, Ur: <0.2 mg/dL (ref <2.0)

## 2015-01-24 ENCOUNTER — Other Ambulatory Visit: Payer: Self-pay | Admitting: *Deleted

## 2015-01-24 MED ORDER — POTASSIUM CHLORIDE ER 10 MEQ PO TBCR: 10.0000 meq | EXTENDED_RELEASE_TABLET | Freq: Every day | ORAL | Status: DC

## 2015-01-24 NOTE — Telephone Encounter (Signed)
Refill on Klorcon 10 meq sent to patient pharmacy

## 2015-01-29 ENCOUNTER — Telehealth: Payer: Self-pay | Admitting: *Deleted

## 2015-01-29 DIAGNOSIS — H40003 Preglaucoma, unspecified, bilateral: Secondary | ICD-10-CM | POA: Diagnosis not present

## 2015-01-29 DIAGNOSIS — H524 Presbyopia: Secondary | ICD-10-CM | POA: Diagnosis not present

## 2015-01-29 DIAGNOSIS — H2513 Age-related nuclear cataract, bilateral: Secondary | ICD-10-CM | POA: Diagnosis not present

## 2015-01-29 NOTE — Telephone Encounter (Signed)
Dr Rene KocherEksir 's office called stated they needed a referral to see patient today. Called [employer] they state there is an active referral dated 11/05/2014 good through 05/04/2015 for 6 visits referral number [id number] . Notified Dr Rene KocherEksir office gave them referral number.

## 2015-01-30 ENCOUNTER — Other Ambulatory Visit: Payer: Self-pay | Admitting: *Deleted

## 2015-01-30 ENCOUNTER — Telehealth: Payer: Self-pay | Admitting: Internal Medicine

## 2015-01-30 MED ORDER — METOPROLOL TARTRATE 100 MG PO TABS: 100.0000 mg | ORAL_TABLET | Freq: Two times a day (BID) | ORAL | Status: DC

## 2015-01-30 NOTE — Telephone Encounter (Signed)
Pt called and would like meloxicam (MOBIC) 15 MG tablet  [facility] [address]

## 2015-01-30 NOTE — Telephone Encounter (Signed)
Refill x 6 months 

## 2015-01-30 NOTE — Telephone Encounter (Signed)
Refills on Meloxicam sent to patient pharmacy

## 2015-01-30 NOTE — Telephone Encounter (Signed)
[facility] [address] [facility] patient spoke with [facility]

## 2015-02-03 ENCOUNTER — Other Ambulatory Visit: Payer: Self-pay | Admitting: *Deleted

## 2015-02-03 MED ORDER — MELOXICAM 15 MG PO TABS: 15.0000 mg | ORAL_TABLET | Freq: Every day | ORAL | Status: DC

## 2015-02-03 NOTE — Telephone Encounter (Signed)
[facility] [address] at patient request

## 2015-02-05 DIAGNOSIS — H2510 Age-related nuclear cataract, unspecified eye: Secondary | ICD-10-CM | POA: Insufficient documentation

## 2015-02-08 DIAGNOSIS — Z0100 Encounter for examination of eyes and vision without abnormal findings: Secondary | ICD-10-CM | POA: Diagnosis not present

## 2015-02-08 DIAGNOSIS — H52209 Unspecified astigmatism, unspecified eye: Secondary | ICD-10-CM | POA: Diagnosis not present

## 2015-02-08 DIAGNOSIS — H524 Presbyopia: Secondary | ICD-10-CM | POA: Diagnosis not present

## 2015-02-08 DIAGNOSIS — H5203 Hypermetropia, bilateral: Secondary | ICD-10-CM | POA: Diagnosis not present

## 2015-02-25 DIAGNOSIS — H40031 Anatomical narrow angle, right eye: Secondary | ICD-10-CM | POA: Diagnosis not present

## 2015-02-25 DIAGNOSIS — H40003 Preglaucoma, unspecified, bilateral: Secondary | ICD-10-CM | POA: Diagnosis not present

## 2015-02-25 DIAGNOSIS — H402222 Chronic angle-closure glaucoma, left eye, moderate stage: Secondary | ICD-10-CM | POA: Diagnosis not present

## 2015-02-26 DIAGNOSIS — H402290 Chronic angle-closure glaucoma, unspecified eye, stage unspecified: Secondary | ICD-10-CM | POA: Insufficient documentation

## 2015-02-26 DIAGNOSIS — H402222 Chronic angle-closure glaucoma, left eye, moderate stage: Secondary | ICD-10-CM | POA: Diagnosis not present

## 2015-02-26 DIAGNOSIS — H40031 Anatomical narrow angle, right eye: Secondary | ICD-10-CM | POA: Diagnosis not present

## 2015-03-03 ENCOUNTER — Other Ambulatory Visit: Payer: Self-pay | Admitting: Internal Medicine

## 2015-03-28 ENCOUNTER — Other Ambulatory Visit: Payer: Self-pay | Admitting: *Deleted

## 2015-03-28 MED ORDER — LISINOPRIL-HYDROCHLOROTHIAZIDE 20-25 MG PO TABS: ORAL_TABLET | ORAL | Status: DC

## 2015-03-28 MED ORDER — SIMVASTATIN 10 MG PO TABS: ORAL_TABLET | ORAL | Status: DC

## 2015-03-28 NOTE — Telephone Encounter (Signed)
Refills sent for simvastatin and lisinopril/hctz

## 2015-04-09 DIAGNOSIS — H40009 Preglaucoma, unspecified, unspecified eye: Secondary | ICD-10-CM | POA: Insufficient documentation

## 2015-04-09 DIAGNOSIS — H4011X1 Primary open-angle glaucoma, mild stage: Secondary | ICD-10-CM | POA: Diagnosis not present

## 2015-04-09 DIAGNOSIS — H40033 Anatomical narrow angle, bilateral: Secondary | ICD-10-CM | POA: Diagnosis not present

## 2015-04-09 DIAGNOSIS — H4011X2 Primary open-angle glaucoma, moderate stage: Secondary | ICD-10-CM | POA: Diagnosis not present

## 2015-04-09 DIAGNOSIS — H401190 Primary open-angle glaucoma, unspecified eye, stage unspecified: Secondary | ICD-10-CM | POA: Insufficient documentation

## 2015-06-17 ENCOUNTER — Other Ambulatory Visit: Payer: Self-pay | Admitting: Internal Medicine

## 2015-06-17 NOTE — Telephone Encounter (Signed)
Refill for one year 

## 2015-06-20 ENCOUNTER — Encounter: Payer: Self-pay | Admitting: Internal Medicine

## 2015-07-01 ENCOUNTER — Other Ambulatory Visit: Payer: Self-pay

## 2015-07-01 MED ORDER — METOPROLOL TARTRATE 100 MG PO TABS: 100.0000 mg | ORAL_TABLET | Freq: Two times a day (BID) | ORAL | Status: DC

## 2015-07-17 ENCOUNTER — Encounter: Payer: Self-pay | Admitting: Internal Medicine

## 2015-07-17 ENCOUNTER — Ambulatory Visit (INDEPENDENT_AMBULATORY_CARE_PROVIDER_SITE_OTHER): Payer: Commercial Managed Care - HMO | Admitting: Internal Medicine

## 2015-07-17 VITALS — BP 130/86 | HR 64 | Temp 98.1°F | Wt 208.5 lb

## 2015-07-17 DIAGNOSIS — M545 Low back pain, unspecified: Secondary | ICD-10-CM

## 2015-07-17 MED ORDER — CYCLOBENZAPRINE HCL 10 MG PO TABS
ORAL_TABLET | ORAL | Status: DC
Start: 2015-07-17 — End: 2016-01-29

## 2015-07-17 MED ORDER — TRAMADOL HCL 50 MG PO TABS: 50.0000 mg | ORAL_TABLET | Freq: Three times a day (TID) | ORAL | Status: DC | PRN

## 2015-07-24 ENCOUNTER — Other Ambulatory Visit: Payer: Commercial Managed Care - HMO | Admitting: Internal Medicine

## 2015-07-24 DIAGNOSIS — E119 Type 2 diabetes mellitus without complications: Secondary | ICD-10-CM | POA: Diagnosis not present

## 2015-07-24 DIAGNOSIS — R5383 Other fatigue: Secondary | ICD-10-CM | POA: Diagnosis not present

## 2015-07-24 DIAGNOSIS — Z79899 Other long term (current) drug therapy: Secondary | ICD-10-CM | POA: Diagnosis not present

## 2015-07-24 DIAGNOSIS — E559 Vitamin D deficiency, unspecified: Secondary | ICD-10-CM

## 2015-07-24 DIAGNOSIS — E785 Hyperlipidemia, unspecified: Secondary | ICD-10-CM | POA: Diagnosis not present

## 2015-07-24 LAB — LIPID PANEL
Cholesterol: 233 mg/dL — ABNORMAL HIGH (ref 125–200)
HDL: 86 mg/dL (ref >=46)
LDL Cholesterol: 128 mg/dL (ref <130)
TRIGLYCERIDES: 96 mg/dL (ref <150)
Total CHOL/HDL Ratio: 2.7 Ratio (ref <=5.0)
VLDL: 19 mg/dL (ref <30)

## 2015-07-24 LAB — CBC WITH DIFFERENTIAL/PLATELET
BASOS ABS: 0.1 10*3/uL (ref 0.0–0.1)
BASOS PCT: 1 % (ref 0–1)
Eosinophils Absolute: 0.5 10*3/uL (ref 0.0–0.7)
Eosinophils Relative: 7 % — ABNORMAL HIGH (ref 0–5)
HEMATOCRIT: 36.1 % (ref 36.0–46.0)
Hemoglobin: 12.5 g/dL (ref 12.0–15.0)
LYMPHS ABS: 3.5 10*3/uL (ref 0.7–4.0)
Lymphocytes Relative: 51 % — ABNORMAL HIGH (ref 12–46)
MCH: 28.2 pg (ref 26.0–34.0)
MCHC: 34.6 g/dL (ref 30.0–36.0)
MCV: 81.3 fL (ref 78.0–100.0)
MONOS PCT: 8 % (ref 3–12)
MPV: 10.2 fL (ref 8.6–12.4)
Monocytes Absolute: 0.5 10*3/uL (ref 0.1–1.0)
NEUTROS ABS: 2.2 10*3/uL (ref 1.7–7.7)
Neutrophils Relative %: 33 % — ABNORMAL LOW (ref 43–77)
Platelets: 225 10*3/uL (ref 150–400)
RBC: 4.44 MIL/uL (ref 3.87–5.11)
RDW: 14.6 % (ref 11.5–15.5)
WBC: 6.8 10*3/uL (ref 4.0–10.5)

## 2015-07-24 LAB — COMPLETE METABOLIC PANEL WITH GFR
ALBUMIN: 4.2 g/dL (ref 3.6–5.1)
ALT: 17 U/L (ref 6–29)
AST: 18 U/L (ref 10–35)
Alkaline Phosphatase: 105 U/L (ref 33–130)
BUN: 16 mg/dL (ref 7–25)
CALCIUM: 10.2 mg/dL (ref 8.6–10.4)
CHLORIDE: 102 mmol/L (ref 98–110)
CO2: 25 mmol/L (ref 20–31)
CREATININE: 0.97 mg/dL — AB (ref 0.60–0.93)
GFR, Est African American: 68 mL/min (ref >=60)
GFR, Est Non African American: 59 mL/min — ABNORMAL LOW (ref >=60)
Glucose, Bld: 112 mg/dL — ABNORMAL HIGH (ref 65–99)
Potassium: 4.2 mmol/L (ref 3.5–5.3)
Sodium: 141 mmol/L (ref 135–146)
Total Bilirubin: 0.5 mg/dL (ref 0.2–1.2)
Total Protein: 7.1 g/dL (ref 6.1–8.1)

## 2015-07-24 LAB — HEMOGLOBIN A1C
Hgb A1c MFr Bld: 6.2 % — ABNORMAL HIGH (ref <5.7)
MEAN PLASMA GLUCOSE: 131 mg/dL — AB (ref <117)

## 2015-07-24 LAB — TSH: TSH: 1.284 u[IU]/mL (ref 0.350–4.500)

## 2015-07-25 ENCOUNTER — Ambulatory Visit (INDEPENDENT_AMBULATORY_CARE_PROVIDER_SITE_OTHER): Payer: Commercial Managed Care - HMO | Admitting: Internal Medicine

## 2015-07-25 ENCOUNTER — Encounter: Payer: Self-pay | Admitting: Internal Medicine

## 2015-07-25 VITALS — BP 130/72 | HR 70 | Temp 98.1°F | Ht 61.0 in | Wt 203.5 lb

## 2015-07-25 DIAGNOSIS — E785 Hyperlipidemia, unspecified: Secondary | ICD-10-CM

## 2015-07-25 DIAGNOSIS — I10 Essential (primary) hypertension: Secondary | ICD-10-CM | POA: Diagnosis not present

## 2015-07-25 DIAGNOSIS — Z7184 Encounter for health counseling related to travel: Secondary | ICD-10-CM

## 2015-07-25 DIAGNOSIS — E119 Type 2 diabetes mellitus without complications: Secondary | ICD-10-CM | POA: Diagnosis not present

## 2015-07-25 DIAGNOSIS — Z0000 Encounter for general adult medical examination without abnormal findings: Secondary | ICD-10-CM | POA: Diagnosis not present

## 2015-07-25 DIAGNOSIS — M8580 Other specified disorders of bone density and structure, unspecified site: Secondary | ICD-10-CM | POA: Diagnosis not present

## 2015-07-25 DIAGNOSIS — Z7189 Other specified counseling: Secondary | ICD-10-CM

## 2015-07-25 DIAGNOSIS — H409 Unspecified glaucoma: Secondary | ICD-10-CM | POA: Diagnosis not present

## 2015-07-25 DIAGNOSIS — M170 Bilateral primary osteoarthritis of knee: Secondary | ICD-10-CM | POA: Diagnosis not present

## 2015-07-25 LAB — POCT URINALYSIS DIPSTICK
Bilirubin, UA: NEGATIVE
Glucose, UA: NEGATIVE
Ketones, UA: NEGATIVE
Leukocytes, UA: NEGATIVE
NITRITE UA: NEGATIVE
PH UA: 6
Protein, UA: NEGATIVE
RBC UA: NEGATIVE
Spec Grav, UA: >=1.03
UROBILINOGEN UA: NEGATIVE

## 2015-07-25 LAB — VITAMIN D 25 HYDROXY (VIT D DEFICIENCY, FRACTURES): Vit D, 25-Hydroxy: 37 ng/mL (ref 30–100)

## 2015-07-25 NOTE — Progress Notes (Signed)
Subjective:    Patient ID: Sharon Russell, female    DOB: 09-28-1946, 69 y.o.   MRN: [id number]  HPI 69 year old Black Female in today for health maintenance exam and evaluation of medical issues. Last physical exam August 2015. At that time had no current eye physician and no recent eye exam. Reminded to have that yearly.  Patient first presented to this office July 2012. Was noted at that time to have been hypertensive for 7 years. She has a history of hypertension, hyperlipidemia, and per glucose tolerance. She is on Zocor, metoprolol, lisinopril/HCTZ, potassium supplement. Glucose intolerance is diet controlled.  Past medical history: Abdominal hysterectomy without oophorectomy 1980 for fibroids. Had Pap smear 2012 which was normal at the vaginal cuff. History of cholecystectomy in 2009. Colonoscopy done 2010 by Dr. Juanda Chance. No polyps noted. 10 year follow-up recommended.  History of osteoarthritis of knees.  Tetanus immunization given July 2012.  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 Husband is retired. Patient has never smoked. Social alcohol consumption. 3 adult female children.  Family history: Sister age 52 diagnosed last year with  breast cancer. Father died of apparent cardiac arrest with history of pacemaker at 63 years of age. Mother died at age 26 in her sleep. Total of 3 brothers-one died with complications of alcoholism with history of seizure disorder, another brother died at age 36 with an MI. One brother and one sister living with hypertension. One sister died of a stroke.    Review of Systems  Constitutional: Negative.   HENT: Negative.   Respiratory: Negative.   Cardiovascular: Negative.   Genitourinary: Negative.   Neurological: Negative.   Psychiatric/Behavioral: Negative.        Objective:   Physical Exam  Constitutional: She is oriented to person, place, and time. She  appears well-developed and well-nourished. No distress.  HENT:  Head: Normocephalic and atraumatic.  Right Ear: External ear normal.  Mouth/Throat: Oropharynx is clear and moist. No oropharyngeal exudate.  Eyes: Conjunctivae and EOM are normal. Pupils are equal, round, and reactive to light. Right eye exhibits no discharge. Left eye exhibits no discharge. No scleral icterus.  Neck: Neck supple. No JVD present. No thyromegaly present.  Cardiovascular: Normal rate, regular rhythm, normal heart sounds and intact distal pulses.   No murmur heard. Pulmonary/Chest: Effort normal and breath sounds normal. No respiratory distress. She has no wheezes. She has no rales.  Breasts normal female  Abdominal: Soft. Bowel sounds are normal. She exhibits no distension and no mass. There is no tenderness. There is no rebound and no guarding.  Genitourinary:  S/p hysterectomy. No Pap done due to age and hyspterectomy. Last Pap vaginal cuff 2012. Bimanual normal today  Musculoskeletal: Normal range of motion. She exhibits no edema.  Lymphadenopathy:    She has no cervical adenopathy.  Neurological: She is alert and oriented to person, place, and time. She has normal reflexes. No cranial nerve deficit. Coordination normal.  Skin: Skin is warm and dry. No rash noted. She is not diaphoretic.  Psychiatric: She has a normal mood and affect. Her behavior is normal. Judgment and thought content normal.  Vitals reviewed.         Assessment & Plan:  Essential hypertension  Hyperlipidemia stable with statin therapy  Osteoarthritis both knees  Impaired glucose tolerance/diet controlled diabetes mellitus. 20 1511 A1c was 6.6% and is now 6.2%. Return in 6 months.  Mild osteopenia-bone density done in 2015 with repeat study recommended in 2 years. Vitamin D recommended only.  Travel advice: Patient asking for prescription for Transderm scope to take on cruise for possible seasickness.  Plan: Continue to work  on diet exercise and return in 6 months. Continue same medications. Annual mammogram. Annual diabetic eye exam. Bone density study done in 2015 showing very mild osteopenia with repeat study recommended in 2017.  Subjective:   Patient presents for Medicare Annual/Subsequent preventive examination.  Review Past Medical/Family/Social: See above  Risk Factors  Current exercise habits: Works out at [employer] and tread female Dietary issues discussed: Low fat low carbohydrate  Cardiac risk factors: Hypertension, hyperlipidemia, and per glucose tolerance  Depression Screen  (Note: if answer to either of the following is "Yes", a more complete depression screening is indicated)   Over the past two weeks, have you felt down, depressed or hopeless? No  Over the past two weeks, have you felt little interest or pleasure in doing things? No Have you lost interest or pleasure in daily life? No Do you often feel hopeless? No Do you cry easily over simple problems? No   Activities of Daily Living  In your present state of health, do you have any difficulty performing the following activities?:   Driving? No  Managing money? No  Feeding yourself? No  Getting from bed to chair? No  Climbing a flight of stairs? No  Preparing food and eating?: No  Bathing or showering? No  Getting dressed: No  Getting to the toilet? No  Using the toilet:No  Moving around from place to place: No  In the past year have you fallen or had a near fall?:No  Are you sexually active? No  Do you have more than one partner? No   Hearing Difficulties: No  Do you often ask people to speak up or repeat themselves? No  Do you experience ringing or noises in your ears? No  Do you have difficulty understanding soft or whispered voices? No  Do you feel that you have a problem with memory? No Do you often misplace items? No    Home Safety:  Do you have a smoke alarm at your residence? Yes Do you have grab bars in the  bathroom? Yes Do you have throw rugs in your house? No   Cognitive Testing  Alert? Yes Normal Appearance?Yes  Oriented to person? Yes Place? Yes  Time? Yes  Recall of three objects? Yes  Can perform simple calculations? Yes  Displays appropriate judgment?Yes  Can read the correct time from a watch face?Yes   List the Names of Other Physician/Practitioners you currently use:  See referral list for the physicians patient is currently seeing.     Review of Systems: See above   Objective:     General appearance: Appears stated age and mildly obese  Head: Normocephalic, without obvious abnormality, atraumatic  Eyes: conj clear, EOMi PEERLA  Ears: normal TM's and external ear canals both ears  Nose: Nares normal. Septum midline. Mucosa normal. No drainage or sinus tenderness.  Throat: lips, mucosa, and tongue normal; teeth and gums normal  Neck: no adenopathy, no carotid bruit, no JVD, supple, symmetrical, trachea midline and thyroid not enlarged, symmetric, no tenderness/mass/nodules  No CVA tenderness.  Lungs: clear to auscultation bilaterally  Breasts: normal appearance, no masses or tenderness, top of the pacemaker on left upper chest. Incision well-healed. It is tender.  Heart: regular rate and rhythm, S1, S2 normal, no murmur,  click, rub or gallop  Abdomen: soft, non-tender; bowel sounds normal; no masses, no organomegaly  Musculoskeletal: ROM normal in all joints, no crepitus, no deformity, Normal muscle strengthen. Back  is symmetric, no curvature. Skin: Skin color, texture, turgor normal. No rashes or lesions  Lymph nodes: Cervical, supraclavicular, and axillary nodes normal.  Neurologic: CN 2 -12 Normal, Normal symmetric reflexes. Normal coordination and gait  Psych: Alert & Oriented x 3, Mood appear stable.    Assessment:    Annual wellness medicare exam   Plan:    During the course of the visit the patient was educated and counseled about appropriate screening  and preventive services including:  Annual mammogram        Patient Instructions (the written plan) was given to the patient.  Medicare Attestation  I have personally reviewed:  The patient's medical and social history  Their use of alcohol, tobacco or illicit drugs  Their current medications and supplements  The patient's functional ability including ADLs,fall risks, home safety risks, cognitive, and hearing and visual impairment  Diet and physical activities  Evidence for depression or mood disorders  The patient's weight, height, BMI, and visual acuity have been recorded in the chart. I have made referrals, counseling, and provided education to the patient based on review of the above and I have provided the patient with a written personalized care plan for preventive services.

## 2015-07-25 NOTE — Patient Instructions (Addendum)
Watch diet and exercise and return in 6 months. Continue same medications. Transderm scope prescribed for seasickness on cruise.

## 2015-07-26 LAB — MICROALBUMIN / CREATININE URINE RATIO
Creatinine, Urine: 224.4 mg/dL
Microalb Creat Ratio: 16.5 mg/g (ref 0.0–30.0)
Microalb, Ur: 3.7 mg/dL — ABNORMAL HIGH (ref <2.0)

## 2015-07-31 ENCOUNTER — Other Ambulatory Visit: Payer: Self-pay | Admitting: Internal Medicine

## 2015-08-08 ENCOUNTER — Encounter: Payer: Self-pay | Admitting: Internal Medicine

## 2015-08-08 DIAGNOSIS — Z1231 Encounter for screening mammogram for malignant neoplasm of breast: Secondary | ICD-10-CM | POA: Diagnosis not present

## 2015-08-27 ENCOUNTER — Other Ambulatory Visit: Payer: Self-pay | Admitting: Internal Medicine

## 2015-09-30 ENCOUNTER — Other Ambulatory Visit: Payer: Self-pay

## 2015-09-30 MED ORDER — LISINOPRIL-HYDROCHLOROTHIAZIDE 20-25 MG PO TABS: ORAL_TABLET | ORAL | Status: DC

## 2015-09-30 MED ORDER — SIMVASTATIN 10 MG PO TABS: ORAL_TABLET | ORAL | Status: DC

## 2015-11-02 NOTE — Progress Notes (Signed)
   Subjective:    Patient ID: Sharon Russell, female    DOB: 07/03/1946, 69 y.o.   MRN: [id number]  HPI Patient here today with low back pain. Does not have medication on hand to take sips for over-the-counter medications which have not helped. She has appointment soon on August 12 for physical exam but did not feel she could wait until then to be treated for back pain. It is not running down her legs just low back pain. She's very uncomfortable.    Review of Systems     Objective:   Physical Exam  Straight leg raising is negative at 90 bilaterally. Deep tendon reflexes 2+ and symmetrical in the knees. Absent in the ankles. Muscle strength is 5 over 5 in the lower extremities. Decreased range of motion in trunk with flexion and extension. She is obese.      Assessment & Plan:  Low back pain  Plan: Meloxicam 15 mg daily. Tramadol 50 mg by mouth 3 times daily as needed for pain. Follow-up on August 12 at time of physical exam.

## 2015-11-02 NOTE — Patient Instructions (Signed)
Take tramadol for pain and take meloxicam daily. Return for follow-up August 12. No heavy lifting, vacuuming or yardwork

## 2015-11-03 ENCOUNTER — Telehealth: Payer: Self-pay

## 2015-11-03 DIAGNOSIS — H409 Unspecified glaucoma: Secondary | ICD-10-CM

## 2015-11-03 NOTE — Telephone Encounter (Signed)
[facility] called requesting a referral for patient due to glaucoma appt in Dec.

## 2015-11-04 NOTE — Telephone Encounter (Signed)
Referral faxed to [phone]

## 2015-11-05 DIAGNOSIS — H401122 Primary open-angle glaucoma, left eye, moderate stage: Secondary | ICD-10-CM | POA: Diagnosis not present

## 2015-11-05 DIAGNOSIS — H401111 Primary open-angle glaucoma, right eye, mild stage: Secondary | ICD-10-CM | POA: Diagnosis not present

## 2016-01-27 ENCOUNTER — Other Ambulatory Visit: Payer: Commercial Managed Care - HMO | Admitting: Internal Medicine

## 2016-01-27 DIAGNOSIS — I10 Essential (primary) hypertension: Secondary | ICD-10-CM | POA: Diagnosis not present

## 2016-01-27 DIAGNOSIS — Z79899 Other long term (current) drug therapy: Secondary | ICD-10-CM

## 2016-01-27 DIAGNOSIS — E785 Hyperlipidemia, unspecified: Secondary | ICD-10-CM

## 2016-01-27 DIAGNOSIS — E119 Type 2 diabetes mellitus without complications: Secondary | ICD-10-CM | POA: Diagnosis not present

## 2016-01-27 LAB — HEPATIC FUNCTION PANEL
ALBUMIN: 4.2 g/dL (ref 3.6–5.1)
ALK PHOS: 99 U/L (ref 33–130)
ALT: 16 U/L (ref 6–29)
AST: 19 U/L (ref 10–35)
BILIRUBIN INDIRECT: 0.4 mg/dL (ref 0.2–1.2)
Bilirubin, Direct: 0.1 mg/dL (ref <=0.2)
Total Bilirubin: 0.5 mg/dL (ref 0.2–1.2)
Total Protein: 7 g/dL (ref 6.1–8.1)

## 2016-01-27 LAB — HEMOGLOBIN A1C
Hgb A1c MFr Bld: 6.6 % — ABNORMAL HIGH (ref <5.7)
MEAN PLASMA GLUCOSE: 143 mg/dL — AB (ref <117)

## 2016-01-27 LAB — LIPID PANEL
Cholesterol: 230 mg/dL — ABNORMAL HIGH (ref 125–200)
HDL: 99 mg/dL (ref >=46)
LDL Cholesterol: 116 mg/dL (ref <130)
TRIGLYCERIDES: 73 mg/dL (ref <150)
Total CHOL/HDL Ratio: 2.3 Ratio (ref <=5.0)
VLDL: 15 mg/dL (ref <30)

## 2016-01-29 ENCOUNTER — Encounter: Payer: Self-pay | Admitting: Internal Medicine

## 2016-01-29 ENCOUNTER — Telehealth: Payer: Self-pay | Admitting: Internal Medicine

## 2016-01-29 ENCOUNTER — Ambulatory Visit (INDEPENDENT_AMBULATORY_CARE_PROVIDER_SITE_OTHER): Payer: PRIVATE HEALTH INSURANCE | Admitting: Internal Medicine

## 2016-01-29 VITALS — BP 136/78 | HR 69 | Temp 98.0°F | Resp 20 | Ht 61.0 in | Wt 206.0 lb

## 2016-01-29 DIAGNOSIS — I10 Essential (primary) hypertension: Secondary | ICD-10-CM

## 2016-01-29 DIAGNOSIS — R609 Edema, unspecified: Secondary | ICD-10-CM | POA: Diagnosis not present

## 2016-01-29 DIAGNOSIS — M1712 Unilateral primary osteoarthritis, left knee: Secondary | ICD-10-CM | POA: Diagnosis not present

## 2016-01-29 DIAGNOSIS — E785 Hyperlipidemia, unspecified: Secondary | ICD-10-CM

## 2016-01-29 DIAGNOSIS — E8881 Metabolic syndrome: Secondary | ICD-10-CM | POA: Diagnosis not present

## 2016-01-29 DIAGNOSIS — E669 Obesity, unspecified: Secondary | ICD-10-CM

## 2016-01-29 DIAGNOSIS — E119 Type 2 diabetes mellitus without complications: Secondary | ICD-10-CM

## 2016-01-29 MED ORDER — FUROSEMIDE 40 MG PO TABS: ORAL_TABLET | ORAL | Status: DC

## 2016-01-29 NOTE — Telephone Encounter (Signed)
Patient given appointment with Dr. Doneen Poisson @ [facility] @ [address] [phone] 02/10/16 @ 0945 Faxed documentation to  [phone] Patient given directions to practice and provided with phone #, date/time of appointment.   [employer] Referral put into [employer].

## 2016-01-30 LAB — MICROALBUMIN, URINE: MICROALB UR: 0.2 mg/dL

## 2016-02-10 DIAGNOSIS — M1712 Unilateral primary osteoarthritis, left knee: Secondary | ICD-10-CM | POA: Insufficient documentation

## 2016-02-10 DIAGNOSIS — R609 Edema, unspecified: Secondary | ICD-10-CM | POA: Insufficient documentation

## 2016-02-10 DIAGNOSIS — E8881 Metabolic syndrome: Secondary | ICD-10-CM | POA: Insufficient documentation

## 2016-02-10 NOTE — Progress Notes (Signed)
   Subjective:    Patient ID: Sharon Russell, female    DOB: 04-15-1946, 70 y.o.   MRN: [id number]  HPI She is here today for six-month follow-up on multiple medical issues including obesity, metabolic syndrome, hyperlipidemia, hypertension, controlled type 2 diabetes mellitus, dependent edema, left knee pain. About a month ago, her husband passed away of an sudden MI. She seems to be dealing with this extremely well. 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 However, she has issues with left knee pain. Would like to see orthopedist. History of glaucoma. Glucose intolerance is diet controlled.  Review of Systems     Objective:   Physical Exam  Skin warm and dry. Nodes none. No JVD thyromegaly or carotid bruits. Chest clear to auscultation. Cardiac exam regular rate and rhythm normal S1 and S2. Extremities without edema. Affect is normal. Misses husband.      Assessment & Plan:  Essential hypertension-stable on current regimen  Controlled type 2 diabetes mellitus  Obesity  Metabolic syndrome  Primary osteoarthritis left knee  Dependent edema-take Lasix sparingly  Hyperlipidemia  Grief reaction  Plan: Continue same medications and return in 6 months for physical exam. Hemoglobin A1c 6.6%. This is slightly higher than last time. However she's been through a lot recently. Total cholesterol was 2:30 with an LDL cholesterol of 116 and HDL cholesterol of 73.

## 2016-02-10 NOTE — Patient Instructions (Signed)
We are sorry to learn about  loss of your husband. Continue same medications and return in 6 months. Orthopedic referral for osteoarthritis of knee.

## 2016-03-09 ENCOUNTER — Encounter: Payer: Self-pay | Admitting: Internal Medicine

## 2016-03-09 ENCOUNTER — Ambulatory Visit (INDEPENDENT_AMBULATORY_CARE_PROVIDER_SITE_OTHER): Payer: Commercial Managed Care - HMO | Admitting: Internal Medicine

## 2016-03-09 VITALS — BP 134/78 | HR 80 | Temp 100.0°F | Resp 20 | Wt 205.0 lb

## 2016-03-09 DIAGNOSIS — J209 Acute bronchitis, unspecified: Secondary | ICD-10-CM

## 2016-03-09 MED ORDER — BENZONATATE 200 MG PO CAPS: 200.0000 mg | ORAL_CAPSULE | Freq: Three times a day (TID) | ORAL | Status: DC | PRN

## 2016-03-09 MED ORDER — LEVOFLOXACIN 500 MG PO TABS: 500.0000 mg | ORAL_TABLET | Freq: Every day | ORAL | Status: DC

## 2016-03-09 MED ORDER — ALBUTEROL SULFATE HFA 108 (90 BASE) MCG/ACT IN AERS: 2.0000 | INHALATION_SPRAY | Freq: Four times a day (QID) | RESPIRATORY_TRACT | Status: DC | PRN

## 2016-03-09 MED ORDER — METHYLPREDNISOLONE ACETATE 80 MG/ML IJ SUSP
80.0000 mg | Freq: Once | INTRAMUSCULAR | Status: AC
  Administered 2016-03-09: 80 mg via INTRAMUSCULAR

## 2016-03-09 NOTE — Progress Notes (Signed)
   Subjective:    Patient ID: Sharon Russell, female    DOB: 12/12/1946, 70 y.o.   MRN: [id number]  HPI Onset 2 days ago of sore throat followed by coughing. Cough is not very productive. Has had fever and chills. No myalgias. Did take flu vaccine. Malaise and fatigue.    Review of Systems see above     Objective:   Physical Exam  Skin warm and dry. Nodes none. TMs are clear. Pharynx is clear. Neck is supple without adenopathy. Chest: Scattered wheezing with deep inspiration. No rales.      Assessment & Plan:  Acute bronchitis  Bronchospasm  Plan:Out of work until afebrile for 24 hours. Levaquin 500 milligrams daily for 10 days. Ventolin inhaler 2 sprays by mouth 4 times daily for cough and wheezing. Tessalon Perles 200 mg 3 times daily as needed for cough. Rest and drink plenty of fluids. Depo-Medrol 80 mg IM

## 2016-03-09 NOTE — Patient Instructions (Addendum)
Levaquin 500 milligrams daily for 10 days. Tessalon Perles 200 mg 3 times daily as needed for cough. Albuterol inhaler 2 sprays by mouth 4 times daily. Depo-Medrol 80 mg IM. Rest and drink plenty of fluids. Do not return to work until afebrile for 24 hours

## 2016-03-16 ENCOUNTER — Other Ambulatory Visit: Payer: Self-pay | Admitting: Internal Medicine

## 2016-04-06 ENCOUNTER — Other Ambulatory Visit: Payer: Self-pay | Admitting: Internal Medicine

## 2016-07-02 DIAGNOSIS — H401122 Primary open-angle glaucoma, left eye, moderate stage: Secondary | ICD-10-CM | POA: Diagnosis not present

## 2016-07-07 DIAGNOSIS — H401122 Primary open-angle glaucoma, left eye, moderate stage: Secondary | ICD-10-CM | POA: Diagnosis not present

## 2016-07-07 DIAGNOSIS — H401111 Primary open-angle glaucoma, right eye, mild stage: Secondary | ICD-10-CM | POA: Diagnosis not present

## 2016-07-08 ENCOUNTER — Other Ambulatory Visit: Payer: Self-pay | Admitting: Internal Medicine

## 2016-07-09 ENCOUNTER — Other Ambulatory Visit: Payer: Self-pay | Admitting: Internal Medicine

## 2016-07-27 ENCOUNTER — Other Ambulatory Visit: Payer: Commercial Managed Care - HMO | Admitting: Internal Medicine

## 2016-07-27 DIAGNOSIS — E119 Type 2 diabetes mellitus without complications: Secondary | ICD-10-CM | POA: Diagnosis not present

## 2016-07-27 DIAGNOSIS — E785 Hyperlipidemia, unspecified: Secondary | ICD-10-CM | POA: Diagnosis not present

## 2016-07-27 DIAGNOSIS — Z0000 Encounter for general adult medical examination without abnormal findings: Secondary | ICD-10-CM | POA: Diagnosis not present

## 2016-07-27 DIAGNOSIS — I10 Essential (primary) hypertension: Secondary | ICD-10-CM | POA: Diagnosis not present

## 2016-07-27 LAB — CBC WITH DIFFERENTIAL/PLATELET
Basophils Absolute: 0 cells/uL (ref 0–200)
Basophils Relative: 0 %
EOS ABS: 632 {cells}/uL — AB (ref 15–500)
EOS PCT: 8 %
HEMATOCRIT: 37.1 % (ref 35.0–45.0)
Hemoglobin: 12.2 g/dL (ref 11.7–15.5)
Lymphs Abs: 3634 cells/uL (ref 850–3900)
MCH: 27.5 pg (ref 27.0–33.0)
MCHC: 32.9 g/dL (ref 32.0–36.0)
MCV: 83.6 fL (ref 80.0–100.0)
MONOS PCT: 9 %
MPV: 10.3 fL (ref 7.5–12.5)
Monocytes Absolute: 711 cells/uL (ref 200–950)
Neutro Abs: 2923 cells/uL (ref 1500–7800)
Neutrophils Relative %: 37 %
PLATELETS: 226 10*3/uL (ref 140–400)
RBC: 4.44 MIL/uL (ref 3.80–5.10)
RDW: 13.6 % (ref 11.0–15.0)
WBC: 7.9 10*3/uL (ref 3.8–10.8)

## 2016-07-27 LAB — COMPLETE METABOLIC PANEL WITH GFR
ALT: 16 U/L (ref 6–29)
AST: 17 U/L (ref 10–35)
Albumin: 4.6 g/dL (ref 3.6–5.1)
Alkaline Phosphatase: 114 U/L (ref 33–130)
BUN: 29 mg/dL — AB (ref 7–25)
CALCIUM: 10.1 mg/dL (ref 8.6–10.4)
CHLORIDE: 103 mmol/L (ref 98–110)
CO2: 23 mmol/L (ref 20–31)
CREATININE: 1.06 mg/dL — AB (ref 0.60–0.93)
GFR, Est African American: 61 mL/min (ref >=60)
GFR, Est Non African American: 53 mL/min — ABNORMAL LOW (ref >=60)
Glucose, Bld: 113 mg/dL — ABNORMAL HIGH (ref 65–99)
Potassium: 4.8 mmol/L (ref 3.5–5.3)
Sodium: 137 mmol/L (ref 135–146)
Total Bilirubin: 0.5 mg/dL (ref 0.2–1.2)
Total Protein: 7.2 g/dL (ref 6.1–8.1)

## 2016-07-27 LAB — LIPID PANEL
CHOL/HDL RATIO: 2.2 ratio (ref <=5.0)
Cholesterol: 261 mg/dL — ABNORMAL HIGH (ref 125–200)
HDL: 120 mg/dL (ref >=46)
LDL CALC: 118 mg/dL (ref <130)
Triglycerides: 116 mg/dL (ref <150)
VLDL: 23 mg/dL (ref <30)

## 2016-07-27 LAB — TSH: TSH: 1.87 m[IU]/L

## 2016-07-28 LAB — HEMOGLOBIN A1C
Hgb A1c MFr Bld: 6 % — ABNORMAL HIGH (ref <5.7)
Mean Plasma Glucose: 126 mg/dL

## 2016-07-28 LAB — MICROALBUMIN / CREATININE URINE RATIO
Creatinine, Urine: 155 mg/dL (ref 20–320)
Microalb Creat Ratio: 3 mcg/mg creat (ref <30)
Microalb, Ur: 0.5 mg/dL

## 2016-07-28 LAB — VITAMIN D 25 HYDROXY (VIT D DEFICIENCY, FRACTURES): VIT D 25 HYDROXY: 32 ng/mL (ref 30–100)

## 2016-07-29 ENCOUNTER — Encounter: Payer: Self-pay | Admitting: Internal Medicine

## 2016-07-29 ENCOUNTER — Ambulatory Visit (INDEPENDENT_AMBULATORY_CARE_PROVIDER_SITE_OTHER): Payer: Commercial Managed Care - HMO | Admitting: Internal Medicine

## 2016-07-29 VITALS — BP 134/78 | HR 64 | Temp 97.5°F | Ht 61.0 in | Wt 208.5 lb

## 2016-07-29 DIAGNOSIS — I10 Essential (primary) hypertension: Secondary | ICD-10-CM | POA: Diagnosis not present

## 2016-07-29 DIAGNOSIS — R609 Edema, unspecified: Secondary | ICD-10-CM | POA: Diagnosis not present

## 2016-07-29 DIAGNOSIS — E8881 Metabolic syndrome: Secondary | ICD-10-CM

## 2016-07-29 DIAGNOSIS — M1712 Unilateral primary osteoarthritis, left knee: Secondary | ICD-10-CM

## 2016-07-29 DIAGNOSIS — R7302 Impaired glucose tolerance (oral): Secondary | ICD-10-CM | POA: Diagnosis not present

## 2016-07-29 DIAGNOSIS — M170 Bilateral primary osteoarthritis of knee: Secondary | ICD-10-CM | POA: Diagnosis not present

## 2016-07-29 DIAGNOSIS — E669 Obesity, unspecified: Secondary | ICD-10-CM | POA: Diagnosis not present

## 2016-07-29 DIAGNOSIS — M8580 Other specified disorders of bone density and structure, unspecified site: Secondary | ICD-10-CM | POA: Diagnosis not present

## 2016-07-29 DIAGNOSIS — Z0000 Encounter for general adult medical examination without abnormal findings: Secondary | ICD-10-CM | POA: Diagnosis not present

## 2016-07-29 DIAGNOSIS — E785 Hyperlipidemia, unspecified: Secondary | ICD-10-CM | POA: Diagnosis not present

## 2016-07-29 LAB — POCT URINALYSIS DIPSTICK
Bilirubin, UA: NEGATIVE
Blood, UA: NEGATIVE
Glucose, UA: NEGATIVE
Ketones, UA: NEGATIVE
LEUKOCYTES UA: NEGATIVE
Nitrite, UA: NEGATIVE
PH UA: 5
PROTEIN UA: NEGATIVE
SPEC GRAV UA: 1.015
UROBILINOGEN UA: 0.2

## 2016-07-29 MED ORDER — ALBUTEROL SULFATE HFA 108 (90 BASE) MCG/ACT IN AERS: 2.0000 | INHALATION_SPRAY | Freq: Four times a day (QID) | RESPIRATORY_TRACT | 99 refills | Status: DC | PRN

## 2016-07-29 MED ORDER — AZITHROMYCIN 250 MG PO TABS: ORAL_TABLET | ORAL | 0 refills | Status: DC

## 2016-07-29 NOTE — Progress Notes (Signed)
Subjective:    Patient ID: Sharon Russell, female    DOB: 02-14-1946, 70 y.o.   MRN: [id number]  HPI   Pleasant 69 year old Black female in today for health maintenance exam and evaluation of medical issues. Patient first presented to this office in July 2012. Was noted at that time to have been hypertensive for 7 years. She has a history of hypertension, hyperlipidemia, empirically dose tolerance. Glucose intolerance his diet control.  Past medical history: Abdominal hysterectomy without oophorectomy 1980 for fibroids. Had Pap smear of vaginal cuff and 2012. History of cholecystectomy in 2009. Colonoscopy done 2010 by Dr. Juanda Chance. No polyps noted in 10 year follow-up recommended.  History of osteoarthritis of knees.  Tetanus immunization given July 2012. Will receive flu vaccine through employment.  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 She was married but husband died earlier this year of an apparent heart attack. He was retired. She's coping well. Patient has never smoked. Social call consumption. 3 adult female children.  Family history: Sr. age 59 diagnosed in 76 with breast cancer. Father died of apparent cardiac rest with history of pacemaker at 28 years of age. Mother died at age 58 in her sleep. Total of 3 brothers-one died of complications of alcoholism with history of seizure disorder, another brother died at age 74 of an MI. One brother and one sister living with hypertension. One sister died of a stroke.    Review of Systems     Objective:   Physical Exam  Constitutional: She is oriented to person, place, and time. She appears well-developed and well-nourished. No distress.  HENT:  Head: Normocephalic and atraumatic.  Right Ear: External ear normal.  Left Ear: External ear normal.  Mouth/Throat: Oropharynx is clear and moist. No oropharyngeal exudate.  Eyes: Conjunctivae and EOM  are normal. Pupils are equal, round, and reactive to light. Right eye exhibits no discharge. Left eye exhibits no discharge.  Neck: Neck supple. No tracheal deviation present.  Cardiovascular: Normal rate, regular rhythm, normal heart sounds and intact distal pulses.   No murmur heard. Pulmonary/Chest: No respiratory distress. She has no wheezes. She has no rales. She exhibits no tenderness.  Breasts normal female  Abdominal: Soft. She exhibits no distension and no mass. There is no tenderness. There is no rebound and no guarding.  Genitourinary:  Genitourinary Comments: Bimanual normal. No Pap done due to age and hysterectomy  Musculoskeletal: She exhibits no edema.  Lymphadenopathy:    She has no cervical adenopathy.  Neurological: She is alert and oriented to person, place, and time. She has normal reflexes. No cranial nerve deficit. Coordination normal.  Skin: Skin is warm and dry. No rash noted. She is not diaphoretic.  Psychiatric: She has a normal mood and affect. Her behavior is normal. Judgment and thought content normal.  Vitals reviewed.         Assessment & Plan:  Hyperlipidemia-HDL cholesterol has increased from 99-120. Total cholesterol looks high at 261 but LDL cholesterol is actually quite good at 118 and triglycerides are normal.  Impaired glucose tolerance-hemoglobin A1c now 6% and previously 6 months ago was 6.6%  Elevated creatinine at 1.06 and previously was 0.9 07/20/2015. Continue to monitor  Essential hypertension-stable on current regimen  Osteoarthritis both knees  History of mild osteopenia-bone density done 2015. Vitamin D recommended only. Vitamin D is low normal. Reminded regarding 2000 units vitamin D 3 daily.  Subjective:   Patient presents for Medicare Annual/Subsequent preventive examination.  Review Past Medical/Family/Social:See above   Risk Factors  Current exercise habits: Walks some Dietary issues discussed: Low fat low  carbohydrate  Cardiac risk factors:Hyperlipidemia, family history, and per glucose tolerance  Depression Screen  (Note: if answer to either of the following is "Yes", a more complete depression screening is indicated)   Over the past two weeks, have you felt down, depressed or hopeless? No  Over the past two weeks, have you felt little interest or pleasure in doing things? No Have you lost interest or pleasure in daily life? No Do you often feel hopeless? No Do you cry easily over simple problems? No   Activities of Daily Living  In your present state of health, do you have any difficulty performing the following activities?:   Driving? No  Managing money? No  Feeding yourself? No  Getting from bed to chair? No  Climbing a flight of stairs? No  Preparing food and eating?: No  Bathing or showering? No  Getting dressed: No  Getting to the toilet? No  Using the toilet:No  Moving around from place to place: No  In the past year have you fallen or had a near fall?:No  Are you sexually active? No  Do you have more than one partner? No   Hearing Difficulties: No  Do you often ask people to speak up or repeat themselves? No  Do you experience ringing or noises in your ears? No  Do you have difficulty understanding soft or whispered voices? No  Do you feel that you have a problem with memory? No Do you often misplace items? No    Home Safety:  Do you have a smoke alarm at your residence? Yes Do you have grab bars in the bathroom?Yes Do you have throw rugs in your house? No   Cognitive Testing  Alert? Yes Normal Appearance?Yes  Oriented to person? Yes Place? Yes  Time? Yes  Recall of three objects? Yes  Can perform simple calculations? Yes  Displays appropriate judgment?Yes  Can read the correct time from a watch face?Yes   List the Names of Other Physician/Practitioners you currently use:  See referral list for the physicians patient is currently seeing.     Review  of Systems:   Objective:     General appearance: Appears stated age and mildly obese  Head: Normocephalic, without obvious abnormality, atraumatic  Eyes: conj clear, EOMi PEERLA  Ears: normal TM's and external ear canals both ears  Nose: Nares normal. Septum midline. Mucosa normal. No drainage or sinus tenderness.  Throat: lips, mucosa, and tongue normal; teeth and gums normal  Neck: no adenopathy, no carotid bruit, no JVD, supple, symmetrical, trachea midline and thyroid not enlarged, symmetric, no tenderness/mass/nodules  No CVA tenderness.  Lungs: clear to auscultation bilaterally  Breasts: normal appearance, no masses or tenderness, top of the pacemaker on left upper chest. Incision well-healed. It is tender.  Heart: regular rate and rhythm, S1, S2 normal, no murmur, click, rub or gallop  Abdomen: soft, non-tender; bowel sounds normal; no masses, no organomegaly  Musculoskeletal: ROM normal in all joints, no crepitus, no deformity, Normal muscle strengthen. Back  is symmetric, no curvature. Skin: Skin color, texture, turgor normal. No rashes or lesions  Lymph nodes: Cervical, supraclavicular, and axillary nodes normal.  Neurologic: CN 2 -12 Normal, Normal symmetric reflexes. Normal coordination and gait  Psych: Alert & Oriented x 3, Mood appear stable.    Assessment:  Annual wellness medicare exam   Plan:    During the course of the visit the patient was educated and counseled about appropriate screening and preventive services including:   Annual mammogram  Annual flu vaccine     Patient Instructions (the written plan) was given to the patient.  Medicare Attestation  I have personally reviewed:  The patient's medical and social history  Their use of alcohol, tobacco or illicit drugs  Their current medications and supplements  The patient's functional ability including ADLs,fall risks, home safety risks, cognitive, and hearing and visual impairment  Diet and  physical activities  Evidence for depression or mood disorders  The patient's weight, height, BMI, and visual acuity have been recorded in the chart. I have made referrals, counseling, and provided education to the patient based on review of the above and I have provided the patient with a written personalized care plan for preventive services.

## 2016-07-30 LAB — MICROALBUMIN / CREATININE URINE RATIO
CREATININE, URINE: 125 mg/dL (ref 20–320)
MICROALB UR: 0.3 mg/dL
Microalb Creat Ratio: 2 mcg/mg creat (ref <30)

## 2016-08-12 NOTE — Patient Instructions (Signed)
It was a pleasure to see you today. Continue same medications and return in 6 months. Have flu vaccine through employment. Reminded about annual eye exam.

## 2016-08-18 DIAGNOSIS — Z803 Family history of malignant neoplasm of breast: Secondary | ICD-10-CM | POA: Diagnosis not present

## 2016-08-18 DIAGNOSIS — Z1231 Encounter for screening mammogram for malignant neoplasm of breast: Secondary | ICD-10-CM | POA: Diagnosis not present

## 2016-08-18 LAB — HM MAMMOGRAPHY

## 2016-09-22 ENCOUNTER — Other Ambulatory Visit: Payer: Self-pay | Admitting: Internal Medicine

## 2016-09-22 NOTE — Telephone Encounter (Signed)
Verbal order by Dr. Lenord FellersBaxley; ok to refill Klor-Con ER 10MeQ for 1 year.  Refill Meloxicam 15mg , #30, 2 refills.  To [facility] to be e-scribed.

## 2016-10-09 ENCOUNTER — Other Ambulatory Visit: Payer: Self-pay | Admitting: Internal Medicine

## 2016-12-31 ENCOUNTER — Other Ambulatory Visit: Payer: Self-pay | Admitting: Internal Medicine

## 2017-01-05 DIAGNOSIS — H401111 Primary open-angle glaucoma, right eye, mild stage: Secondary | ICD-10-CM | POA: Diagnosis not present

## 2017-01-05 DIAGNOSIS — H401122 Primary open-angle glaucoma, left eye, moderate stage: Secondary | ICD-10-CM | POA: Diagnosis not present

## 2017-01-05 DIAGNOSIS — H2513 Age-related nuclear cataract, bilateral: Secondary | ICD-10-CM | POA: Diagnosis not present

## 2017-01-10 ENCOUNTER — Other Ambulatory Visit: Payer: Self-pay | Admitting: Internal Medicine

## 2017-01-25 ENCOUNTER — Other Ambulatory Visit: Payer: Medicare HMO | Admitting: Internal Medicine

## 2017-01-25 DIAGNOSIS — E785 Hyperlipidemia, unspecified: Secondary | ICD-10-CM | POA: Diagnosis not present

## 2017-01-25 DIAGNOSIS — E119 Type 2 diabetes mellitus without complications: Secondary | ICD-10-CM

## 2017-01-25 DIAGNOSIS — Z79899 Other long term (current) drug therapy: Secondary | ICD-10-CM

## 2017-01-25 DIAGNOSIS — I10 Essential (primary) hypertension: Secondary | ICD-10-CM | POA: Diagnosis not present

## 2017-01-25 LAB — HEPATIC FUNCTION PANEL
ALT: 13 U/L (ref 6–29)
AST: 16 U/L (ref 10–35)
Albumin: 4.3 g/dL (ref 3.6–5.1)
Alkaline Phosphatase: 97 U/L (ref 33–130)
BILIRUBIN DIRECT: 0.1 mg/dL (ref <=0.2)
BILIRUBIN INDIRECT: 0.4 mg/dL (ref 0.2–1.2)
BILIRUBIN TOTAL: 0.5 mg/dL (ref 0.2–1.2)
Total Protein: 7 g/dL (ref 6.1–8.1)

## 2017-01-25 LAB — LIPID PANEL
CHOL/HDL RATIO: 2.5 ratio (ref <5.0)
Cholesterol: 202 mg/dL — ABNORMAL HIGH (ref <200)
HDL: 80 mg/dL (ref >50)
LDL CALC: 106 mg/dL — AB (ref <100)
Triglycerides: 81 mg/dL (ref <150)
VLDL: 16 mg/dL (ref <30)

## 2017-01-26 LAB — HEMOGLOBIN A1C
Hgb A1c MFr Bld: 6.3 % — ABNORMAL HIGH (ref <5.7)
Mean Plasma Glucose: 134 mg/dL

## 2017-01-27 ENCOUNTER — Ambulatory Visit (INDEPENDENT_AMBULATORY_CARE_PROVIDER_SITE_OTHER): Payer: Medicare HMO | Admitting: Internal Medicine

## 2017-01-27 ENCOUNTER — Encounter: Payer: Self-pay | Admitting: Internal Medicine

## 2017-01-27 VITALS — BP 140/80 | HR 55 | Temp 97.6°F | Wt 205.8 lb

## 2017-01-27 DIAGNOSIS — J209 Acute bronchitis, unspecified: Secondary | ICD-10-CM

## 2017-01-27 DIAGNOSIS — I10 Essential (primary) hypertension: Secondary | ICD-10-CM

## 2017-01-27 DIAGNOSIS — H409 Unspecified glaucoma: Secondary | ICD-10-CM

## 2017-01-27 DIAGNOSIS — E8881 Metabolic syndrome: Secondary | ICD-10-CM | POA: Diagnosis not present

## 2017-01-27 DIAGNOSIS — E785 Hyperlipidemia, unspecified: Secondary | ICD-10-CM

## 2017-01-27 DIAGNOSIS — E119 Type 2 diabetes mellitus without complications: Secondary | ICD-10-CM | POA: Diagnosis not present

## 2017-01-27 MED ORDER — AZITHROMYCIN 250 MG PO TABS: ORAL_TABLET | ORAL | 0 refills | Status: DC

## 2017-01-27 NOTE — Progress Notes (Signed)
   Subjective:    Patient ID: Sharon Russell, female    DOB: 08/08/1946, 71 y.o.   MRN: [id number]  HPI  71 year old Black Female Here for six-month recheck. Patient seen in January by optometrist regarding glaucoma. Was placed on latanoprost eyedrops at bedtime in each eye.  History of impaired glucose tolerance, hypertension, hyperlipidemia. Glucose intolerance is diet controlled.  Hemoglobin A1c is 6.3% and previously was 6%. Total cholesterol has improved from 261-202. LDL cholesterol has improved from 118-106. Triglycerides are normal. HDL cholesterol previously noted to be 120 is now 80. Liver panel is normal.  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 Husband died in 2017 of an apparent heart attack. Nonsmoker. Social alcohol consumption.  She has come down with a respiratory infection. No fever or shaking chills. Just cough and congestion.    Review of Systems see above     Objective:   Physical Exam Blood pressure 140/80. Weight 205.75 pounds. Previous visit- blood pressure was 134/78 in office. Skin warm and dry. Nodes none. Neck is supple without JVD thyromegaly or carotid bruits. Chest clear to auscultation. Cardiac exam regular rate and rhythm normal S1 and S2. Extremities without edema.      Assessment & Plan:   Hypertension-would like to see blood pressure a bit better. She currently is on Lopressor 100 mg daily, lisinopril HCTZ 20/25 daily. We could add Norvasc but it may aggravate dependent edema. We could change lisinopril to Benicar. She should keep an eye on her blood pressure at home and call me if persistently elevated.  Hyperlipidemia-continue statin therapy  Obesity-continue to work on diet and exercise  Impaired glucose tolerance-continue to monitor.  Glaucoma-treated by optometrist  Acute bronchitis-treated with Zithromax Z-Pak  Plan: Physical exam due in  August. She will contact me if blood pressures not under good control.

## 2017-01-28 LAB — MICROALBUMIN / CREATININE URINE RATIO
CREATININE, URINE: 58 mg/dL (ref 20–320)
MICROALB UR: 0.2 mg/dL
MICROALB/CREAT RATIO: 3 ug/mg{creat} (ref <30)

## 2017-02-08 NOTE — Patient Instructions (Signed)
Zithromax Z-PAK for respiratory infection. Continue diet and exercise efforts. Watch blood pressure and call if not under good control. Return in August for physical exam.

## 2017-02-28 ENCOUNTER — Encounter: Payer: Self-pay | Admitting: Internal Medicine

## 2017-02-28 ENCOUNTER — Ambulatory Visit (INDEPENDENT_AMBULATORY_CARE_PROVIDER_SITE_OTHER): Payer: Medicare HMO | Admitting: Internal Medicine

## 2017-02-28 VITALS — BP 120/70 | HR 58 | Ht 61.0 in | Wt 207.0 lb

## 2017-02-28 DIAGNOSIS — I10 Essential (primary) hypertension: Secondary | ICD-10-CM

## 2017-02-28 NOTE — Progress Notes (Signed)
   Subjective:    Patient ID: Sharon Russell, female    DOB: 12/27/1945, 71 y.o.   MRN: [id number]  HPI   71 year old Female for follow up HTN. At last visit, Blood pressure was elevated at 140/80. Patient thought it was elevated because of a death of an in-law.  Patient stopped cooking with salt. She only lightly salts her food if necessary. Most of the time she doesn't need salt. Her blood pressures excellent today at 120/70.  We did not change her antihypertensive regimen at last visit.    Review of Systems no new complaints     Objective:   Physical Exam Neck is supple without JVD thyromegaly or carotid bruits. Chest clear to auscultation. Cardiac exam regular rate and rhythm normal S1 and S2. Extremities without edema       Assessment & Plan:  Essential hypertension-patient has been watching it at home and has obtained similar readings as today  Plan: Return in August for six-month recheck and fasting labs at that time. Continue same antihypertensive regimen.

## 2017-02-28 NOTE — Patient Instructions (Signed)
Continue same medications and return for six-month follow-up in August

## 2017-04-02 ENCOUNTER — Other Ambulatory Visit: Payer: Self-pay | Admitting: Internal Medicine

## 2017-04-04 ENCOUNTER — Other Ambulatory Visit: Payer: Self-pay

## 2017-04-04 MED ORDER — GLUCOSE BLOOD VI STRP: ORAL_STRIP | 12 refills | Status: DC

## 2017-04-11 ENCOUNTER — Other Ambulatory Visit: Payer: Self-pay | Admitting: Internal Medicine

## 2017-06-17 DIAGNOSIS — H401111 Primary open-angle glaucoma, right eye, mild stage: Secondary | ICD-10-CM | POA: Diagnosis not present

## 2017-06-17 DIAGNOSIS — H401122 Primary open-angle glaucoma, left eye, moderate stage: Secondary | ICD-10-CM | POA: Diagnosis not present

## 2017-07-11 ENCOUNTER — Other Ambulatory Visit: Payer: Self-pay | Admitting: Internal Medicine

## 2017-07-11 DIAGNOSIS — H5203 Hypermetropia, bilateral: Secondary | ICD-10-CM | POA: Diagnosis not present

## 2017-07-11 DIAGNOSIS — H402211 Chronic angle-closure glaucoma, right eye, mild stage: Secondary | ICD-10-CM | POA: Diagnosis not present

## 2017-07-11 DIAGNOSIS — H2513 Age-related nuclear cataract, bilateral: Secondary | ICD-10-CM | POA: Diagnosis not present

## 2017-07-11 DIAGNOSIS — H524 Presbyopia: Secondary | ICD-10-CM | POA: Diagnosis not present

## 2017-07-11 DIAGNOSIS — H401111 Primary open-angle glaucoma, right eye, mild stage: Secondary | ICD-10-CM | POA: Diagnosis not present

## 2017-07-11 DIAGNOSIS — H402222 Chronic angle-closure glaucoma, left eye, moderate stage: Secondary | ICD-10-CM | POA: Diagnosis not present

## 2017-07-12 DIAGNOSIS — H5203 Hypermetropia, bilateral: Secondary | ICD-10-CM | POA: Diagnosis not present

## 2017-07-12 DIAGNOSIS — H524 Presbyopia: Secondary | ICD-10-CM | POA: Diagnosis not present

## 2017-07-29 ENCOUNTER — Other Ambulatory Visit: Payer: Medicare HMO | Admitting: Internal Medicine

## 2017-07-29 DIAGNOSIS — E119 Type 2 diabetes mellitus without complications: Secondary | ICD-10-CM

## 2017-07-29 DIAGNOSIS — E785 Hyperlipidemia, unspecified: Secondary | ICD-10-CM

## 2017-07-29 DIAGNOSIS — I10 Essential (primary) hypertension: Secondary | ICD-10-CM

## 2017-07-29 DIAGNOSIS — Z0000 Encounter for general adult medical examination without abnormal findings: Secondary | ICD-10-CM

## 2017-07-29 LAB — CBC WITH DIFFERENTIAL/PLATELET
BASOS PCT: 1 %
Basophils Absolute: 60 cells/uL (ref 0–200)
EOS PCT: 13 %
Eosinophils Absolute: 780 cells/uL — ABNORMAL HIGH (ref 15–500)
HCT: 36.4 % (ref 35.0–45.0)
Hemoglobin: 11.9 g/dL (ref 11.7–15.5)
LYMPHS ABS: 2640 {cells}/uL (ref 850–3900)
Lymphocytes Relative: 44 %
MCH: 26.9 pg — ABNORMAL LOW (ref 27.0–33.0)
MCHC: 32.7 g/dL (ref 32.0–36.0)
MCV: 82.2 fL (ref 80.0–100.0)
MPV: 10.2 fL (ref 7.5–12.5)
Monocytes Absolute: 480 cells/uL (ref 200–950)
Monocytes Relative: 8 %
NEUTROS ABS: 2040 {cells}/uL (ref 1500–7800)
Neutrophils Relative %: 34 %
Platelets: 223 10*3/uL (ref 140–400)
RBC: 4.43 MIL/uL (ref 3.80–5.10)
RDW: 14.6 % (ref 11.0–15.0)
WBC: 6 10*3/uL (ref 3.8–10.8)

## 2017-07-29 LAB — TSH: TSH: 0.96 mIU/L

## 2017-07-30 LAB — LIPID PANEL
CHOL/HDL RATIO: 2.3 ratio (ref <5.0)
Cholesterol: 242 mg/dL — ABNORMAL HIGH (ref <200)
HDL: 103 mg/dL (ref >50)
LDL CALC: 123 mg/dL — AB (ref <100)
TRIGLYCERIDES: 79 mg/dL (ref <150)
VLDL: 16 mg/dL (ref <30)

## 2017-07-30 LAB — COMPLETE METABOLIC PANEL WITH GFR
ALK PHOS: 109 U/L (ref 33–130)
ALT: 10 U/L (ref 6–29)
AST: 15 U/L (ref 10–35)
Albumin: 4.3 g/dL (ref 3.6–5.1)
BILIRUBIN TOTAL: 0.5 mg/dL (ref 0.2–1.2)
BUN: 29 mg/dL — AB (ref 7–25)
CO2: 20 mmol/L (ref 20–32)
Calcium: 9.9 mg/dL (ref 8.6–10.4)
Chloride: 105 mmol/L (ref 98–110)
Creat: 1.19 mg/dL — ABNORMAL HIGH (ref 0.60–0.93)
GFR, Est African American: 52 mL/min — ABNORMAL LOW (ref >=60)
GFR, Est Non African American: 45 mL/min — ABNORMAL LOW (ref >=60)
GLUCOSE: 85 mg/dL (ref 65–99)
Potassium: 4.8 mmol/L (ref 3.5–5.3)
SODIUM: 138 mmol/L (ref 135–146)
TOTAL PROTEIN: 6.9 g/dL (ref 6.1–8.1)

## 2017-07-30 LAB — HEMOGLOBIN A1C
Hgb A1c MFr Bld: 5.9 % — ABNORMAL HIGH (ref <5.7)
Mean Plasma Glucose: 123 mg/dL

## 2017-07-30 LAB — MICROALBUMIN / CREATININE URINE RATIO
Creatinine, Urine: 121 mg/dL (ref 20–320)
MICROALB/CREAT RATIO: 55 ug/mg{creat} — AB (ref <30)
Microalb, Ur: 6.6 mg/dL

## 2017-08-01 ENCOUNTER — Encounter: Payer: Self-pay | Admitting: Internal Medicine

## 2017-08-01 ENCOUNTER — Ambulatory Visit (INDEPENDENT_AMBULATORY_CARE_PROVIDER_SITE_OTHER): Payer: Medicare HMO | Admitting: Internal Medicine

## 2017-08-01 VITALS — BP 122/70 | HR 57 | Temp 98.1°F | Ht 60.75 in | Wt 199.0 lb

## 2017-08-01 DIAGNOSIS — E785 Hyperlipidemia, unspecified: Secondary | ICD-10-CM | POA: Diagnosis not present

## 2017-08-01 DIAGNOSIS — M8580 Other specified disorders of bone density and structure, unspecified site: Secondary | ICD-10-CM | POA: Diagnosis not present

## 2017-08-01 DIAGNOSIS — H409 Unspecified glaucoma: Secondary | ICD-10-CM | POA: Diagnosis not present

## 2017-08-01 DIAGNOSIS — E8881 Metabolic syndrome: Secondary | ICD-10-CM | POA: Diagnosis not present

## 2017-08-01 DIAGNOSIS — R7989 Other specified abnormal findings of blood chemistry: Secondary | ICD-10-CM | POA: Diagnosis not present

## 2017-08-01 DIAGNOSIS — I10 Essential (primary) hypertension: Secondary | ICD-10-CM | POA: Diagnosis not present

## 2017-08-01 DIAGNOSIS — E119 Type 2 diabetes mellitus without complications: Secondary | ICD-10-CM

## 2017-08-01 DIAGNOSIS — Z0000 Encounter for general adult medical examination without abnormal findings: Secondary | ICD-10-CM

## 2017-08-01 DIAGNOSIS — Z6837 Body mass index (BMI) 37.0-37.9, adult: Secondary | ICD-10-CM | POA: Diagnosis not present

## 2017-08-01 DIAGNOSIS — M1712 Unilateral primary osteoarthritis, left knee: Secondary | ICD-10-CM

## 2017-08-01 DIAGNOSIS — R8290 Unspecified abnormal findings in urine: Secondary | ICD-10-CM | POA: Diagnosis not present

## 2017-08-01 LAB — POCT URINALYSIS DIPSTICK
Bilirubin, UA: NEGATIVE
Glucose, UA: NEGATIVE
Ketones, UA: NEGATIVE
Nitrite, UA: NEGATIVE
PH UA: 6 (ref 5.0–8.0)
PROTEIN UA: NEGATIVE
SPEC GRAV UA: 1.02 (ref 1.010–1.025)
UROBILINOGEN UA: 0.2 U/dL

## 2017-08-01 NOTE — Progress Notes (Signed)
Subjective:    Patient ID: Sharon Russell, female    DOB: July 01, 1946, 71 y.o.   MRN: [id number]  HPI 71 year old Black Female for Medicare wellness exam, health maintenance and evaluation of medical issues. No new complaints. Hgb AIC improved from6.36 months ago to 5.9%  Of concern, creatinine was 0.97 in 2015-05-18 and 1.06 and 05-17-2016, and is now 1.19. We'll need to continue to monitor for chronic kidney disease with multiple medical issues.  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 Husband died in 05/17/16 of an apparent heart attack. Patient has never smoked. Social alcohol consumption. 3 adult female children.  History of osteoarthritis of knees.  Her seasonal flu vaccine through employment.  She first presented to this office in July 2012 and had been hypertensive at that time for about 7 years.  Past medical history: Abdominal hysterectomy without oophorectomy 1980 for fibroids. Had Pap smear of vaginal cuff and 05-18-11. History of cholecystectomy in 05-17-08. Colonoscopy done 05-17-2009 by Dr. Juanda Chance. No polyps noted and 10 year follow-up recommended.  Tetanus immunization given July 2012.  Family history: Sr. age 73 diagnosed in 69 with breast cancer. Father died of apparent cardiac arrest with history of pacemaker at 62 years of age. Mother died at age 54 in her sleep. Total of 3 brothers-one died of complications of alcoholism with history of seizure disorder and another brother died at age 60 of an MI. One brother and one sister living with hypertension. One sister died of a stroke.    Review of Systems  Constitutional: Negative.   All other systems reviewed and are negative.      Objective:   Physical Exam  Constitutional: She is oriented to person, place, and time. She appears well-developed and well-nourished. No distress.  HENT:  Head: Normocephalic and atraumatic.  Right Ear: External ear  normal.  Left Ear: External ear normal.  Mouth/Throat: Oropharynx is clear and moist.  Eyes: Pupils are equal, round, and reactive to light. Conjunctivae and EOM are normal.  Neck: Neck supple. No JVD present. No thyromegaly present.  Cardiovascular: Normal rate, regular rhythm and normal heart sounds.   No murmur heard. Pulses 1+ and symmetrical in the feet  Pulmonary/Chest: Effort normal and breath sounds normal. No respiratory distress. She has no wheezes.  Abdominal: Soft. Bowel sounds are normal. She exhibits no distension and no mass. There is no tenderness. There is no rebound and no guarding.  Genitourinary:  Genitourinary Comments: Bimanual normal  Musculoskeletal: She exhibits no edema.  Lymphadenopathy:    She has no cervical adenopathy.  Neurological: She is alert and oriented to person, place, and time. She has normal reflexes. No cranial nerve deficit. Coordination normal.  Skin: Skin is warm and dry. No rash noted. She is not diaphoretic.  Psychiatric: She has a normal mood and affect. Her behavior is normal. Judgment and thought content normal.  Vitals reviewed.         Assessment & Plan:  Essential hypertension-blood pressure under good control on current regimen  Controlled type 2 diabetes mellitus-improved from last visit  Obesity-continue to work with diet exercise and weight loss  Hyperlipidemia-Continue statin therapy but total cholesterol has increased from 202-242 and LDL has increased from 106-123. Need to watch diet and try to exercise.  Metabolic syndrome  Elevated serum creatinine- continue to monitor  Osteoarthritis of both knees  History of  mild osteopenia-bone density study done 2015. Continue vitamin D supplement. Mammogram due in September.  Plan: Continue to work on diet exercise and weight loss. Continue same medications and follow-up in 6 months.  Subjective:   Patient presents for Medicare Annual/Subsequent preventive  examination.  Review Past Medical/Family/Social:See above   Risk Factors  Current exercise habits: Some walking Dietary issues discussed: Low fat low carbohydrate   Cardiac risk factors:Family history, hyperlipidemia, diabetes mellitus  Depression Screen  (Note: if answer to either of the following is "Yes", a more complete depression screening is indicated)   Over the past two weeks, have you felt down, depressed or hopeless? No  Over the past two weeks, have you felt little interest or pleasure in doing things? No Have you lost interest or pleasure in daily life? No Do you often feel hopeless? No Do you cry easily over simple problems? No   Activities of Daily Living  In your present state of health, do you have any difficulty performing the following activities?:   Driving? No  Managing money? No  Feeding yourself? No  Getting from bed to chair? No  Climbing a flight of stairs? No  Preparing food and eating?: No  Bathing or showering? No  Getting dressed: No  Getting to the toilet? No  Using the toilet:No  Moving around from place to place: No  In the past year have you fallen or had a near fall?:No  Are you sexually active? No  Do you have more than one partner? No   Hearing Difficulties: No  Do you often ask people to speak up or repeat themselves? No  Do you experience ringing or noises in your ears? No  Do you have difficulty understanding soft or whispered voices? No  Do you feel that you have a problem with memory? No Do you often misplace items? No    Home Safety:  Do you have a smoke alarm at your residence? Yes Do you have grab bars in the bathroom?Yes Do you have throw rugs in your house? No   Cognitive Testing  Alert? Yes Normal Appearance?Yes  Oriented to person? Yes Place? Yes  Time? Yes  Recall of three objects? Yes  Can perform simple calculations? Yes  Displays appropriate judgment?Yes  Can read the correct time from a watch face?Yes    List the Names of Other Physician/Practitioners you currently use:  See referral list for the physicians patient is currently seeing.     Review of Systems: See above   Objective:     General appearance: Appears stated age and obese  Head: Normocephalic, without obvious abnormality, atraumatic  Eyes: conj clear, EOMi PEERLA  Ears: normal TM's and external ear canals both ears  Nose: Nares normal. Septum midline. Mucosa normal. No drainage or sinus tenderness.  Throat: lips, mucosa, and tongue normal; teeth and gums normal  Neck: no adenopathy, no carotid bruit, no JVD, supple, symmetrical, trachea midline and thyroid not enlarged, symmetric, no tenderness/mass/nodules  No CVA tenderness.  Lungs: clear to auscultation bilaterally  Breasts: normal appearance, no masses or tenderness Heart: regular rate and rhythm, S1, S2 normal, no murmur, click, rub or gallop  Abdomen: soft, non-tender; bowel sounds normal; no masses, no organomegaly  Musculoskeletal: ROM normal in all joints, no crepitus, no deformity, Normal muscle strengthen. Back  is symmetric, no curvature. Skin: Skin color, texture, turgor normal. No rashes or lesions  Lymph nodes: Cervical, supraclavicular, and axillary nodes normal.  Neurologic: CN 2 -12 Normal, Normal  symmetric reflexes. Normal coordination and gait  Psych: Alert & Oriented x 3, Mood appear stable.    Assessment:    Annual wellness medicare exam   Plan:    During the course of the visit the patient was educated and counseled about appropriate screening and preventive services including:   Annual mammogram  Annual flu vaccine  Colonoscopy up-to-date     Patient Instructions (the written plan) was given to the patient.  Medicare Attestation  I have personally reviewed:  The patient's medical and social history  Their use of alcohol, tobacco or illicit drugs  Their current medications and supplements  The patient's functional ability  including ADLs,fall risks, home safety risks, cognitive, and hearing and visual impairment  Diet and physical activities  Evidence for depression or mood disorders  The patient's weight, height, BMI, and visual acuity have been recorded in the chart. I have made referrals, counseling, and provided education to the patient based on review of the above and I have provided the patient with a written personalized care plan for preventive services.

## 2017-08-01 NOTE — Patient Instructions (Signed)
It was a pleasure to see you today. Please work on diet exercise and weight loss. Follow a low-fat diet as lipids have increased. We will continue to monitor serum creatinine. Follow-up in 6 months. Continue same medications.

## 2017-08-03 LAB — URINE CULTURE

## 2017-08-04 ENCOUNTER — Other Ambulatory Visit: Payer: Self-pay

## 2017-08-04 MED ORDER — CIPROFLOXACIN HCL 250 MG PO TABS: 250.0000 mg | ORAL_TABLET | Freq: Two times a day (BID) | ORAL | 0 refills | Status: DC

## 2017-08-04 NOTE — Telephone Encounter (Signed)
Unable to reach patient by phone, sent cipro to [facility] on [address]. Pt needs to come back in 2 weeks for a UA.

## 2017-08-19 ENCOUNTER — Encounter: Payer: Medicare HMO | Admitting: Internal Medicine

## 2017-08-25 ENCOUNTER — Ambulatory Visit (INDEPENDENT_AMBULATORY_CARE_PROVIDER_SITE_OTHER): Payer: Medicare HMO | Admitting: Internal Medicine

## 2017-08-25 VITALS — BP 140/80 | HR 68 | Temp 97.8°F

## 2017-08-25 DIAGNOSIS — N390 Urinary tract infection, site not specified: Secondary | ICD-10-CM | POA: Diagnosis not present

## 2017-08-25 LAB — POCT URINALYSIS DIPSTICK
Bilirubin, UA: NEGATIVE
Blood, UA: NEGATIVE
GLUCOSE UA: NEGATIVE
KETONES UA: NEGATIVE
Leukocytes, UA: NEGATIVE
Nitrite, UA: NEGATIVE
PROTEIN UA: NEGATIVE
Spec Grav, UA: 1.025 (ref 1.010–1.025)
UROBILINOGEN UA: 0.2 U/dL
pH, UA: 5 (ref 5.0–8.0)

## 2017-08-25 NOTE — Patient Instructions (Addendum)
Normal urinalysis. UTI resolved.

## 2017-08-25 NOTE — Progress Notes (Signed)
Nurse visit for recheck urinalysis. Normal findings.

## 2017-09-10 ENCOUNTER — Other Ambulatory Visit: Payer: Self-pay | Admitting: Internal Medicine

## 2017-09-17 ENCOUNTER — Encounter: Payer: Self-pay | Admitting: Internal Medicine

## 2017-09-17 DIAGNOSIS — Z803 Family history of malignant neoplasm of breast: Secondary | ICD-10-CM | POA: Diagnosis not present

## 2017-09-17 DIAGNOSIS — Z1231 Encounter for screening mammogram for malignant neoplasm of breast: Secondary | ICD-10-CM | POA: Diagnosis not present

## 2017-10-15 ENCOUNTER — Other Ambulatory Visit: Payer: Self-pay | Admitting: Internal Medicine

## 2017-10-17 NOTE — Telephone Encounter (Signed)
Refill all x 6 months 

## 2017-11-25 ENCOUNTER — Other Ambulatory Visit: Payer: Self-pay | Admitting: Internal Medicine

## 2017-12-28 ENCOUNTER — Other Ambulatory Visit: Payer: Self-pay | Admitting: Internal Medicine

## 2017-12-28 DIAGNOSIS — Z79899 Other long term (current) drug therapy: Secondary | ICD-10-CM

## 2017-12-28 DIAGNOSIS — E785 Hyperlipidemia, unspecified: Secondary | ICD-10-CM

## 2017-12-28 DIAGNOSIS — E118 Type 2 diabetes mellitus with unspecified complications: Secondary | ICD-10-CM

## 2018-01-03 ENCOUNTER — Other Ambulatory Visit: Payer: Self-pay | Admitting: Internal Medicine

## 2018-01-03 MED ORDER — ALBUTEROL SULFATE HFA 108 (90 BASE) MCG/ACT IN AERS: 2.0000 | INHALATION_SPRAY | Freq: Four times a day (QID) | RESPIRATORY_TRACT | 99 refills | Status: DC | PRN

## 2018-01-03 NOTE — Telephone Encounter (Signed)
Change to [employer] prn one year refills

## 2018-01-30 ENCOUNTER — Other Ambulatory Visit: Payer: Medicare HMO | Admitting: Internal Medicine

## 2018-01-30 DIAGNOSIS — E785 Hyperlipidemia, unspecified: Secondary | ICD-10-CM

## 2018-01-30 DIAGNOSIS — E118 Type 2 diabetes mellitus with unspecified complications: Secondary | ICD-10-CM

## 2018-01-30 DIAGNOSIS — Z79899 Other long term (current) drug therapy: Secondary | ICD-10-CM | POA: Diagnosis not present

## 2018-01-31 LAB — MICROALBUMIN / CREATININE URINE RATIO
CREATININE, URINE: 199 mg/dL (ref 20–275)
MICROALB/CREAT RATIO: 6 ug/mg{creat} (ref <30)
Microalb, Ur: 1.2 mg/dL

## 2018-01-31 LAB — HEPATIC FUNCTION PANEL
AG Ratio: 1.7 (calc) (ref 1.0–2.5)
ALBUMIN MSPROF: 4.3 g/dL (ref 3.6–5.1)
ALT: 12 U/L (ref 6–29)
AST: 16 U/L (ref 10–35)
Alkaline phosphatase (APISO): 104 U/L (ref 33–130)
BILIRUBIN DIRECT: 0.1 mg/dL (ref 0.0–0.2)
BILIRUBIN TOTAL: 0.5 mg/dL (ref 0.2–1.2)
Globulin: 2.6 g/dL (calc) (ref 1.9–3.7)
Indirect Bilirubin: 0.4 mg/dL (calc) (ref 0.2–1.2)
Total Protein: 6.9 g/dL (ref 6.1–8.1)

## 2018-01-31 LAB — HEMOGLOBIN A1C
HEMOGLOBIN A1C: 5.8 %{Hb} — AB (ref <5.7)
Mean Plasma Glucose: 120 (calc)
eAG (mmol/L): 6.6 (calc)

## 2018-01-31 LAB — LIPID PANEL
CHOL/HDL RATIO: 2.5 (calc) (ref <5.0)
CHOLESTEROL: 211 mg/dL — AB (ref <200)
HDL: 86 mg/dL (ref >50)
LDL Cholesterol (Calc): 107 mg/dL (calc) — ABNORMAL HIGH
Non-HDL Cholesterol (Calc): 125 mg/dL (calc) (ref <130)
Triglycerides: 89 mg/dL (ref <150)

## 2018-02-02 ENCOUNTER — Ambulatory Visit (INDEPENDENT_AMBULATORY_CARE_PROVIDER_SITE_OTHER): Payer: Medicare HMO | Admitting: Internal Medicine

## 2018-02-02 ENCOUNTER — Encounter: Payer: Self-pay | Admitting: Internal Medicine

## 2018-02-02 VITALS — BP 150/82 | HR 62 | Ht 60.75 in | Wt 200.0 lb

## 2018-02-02 DIAGNOSIS — E119 Type 2 diabetes mellitus without complications: Secondary | ICD-10-CM

## 2018-02-02 DIAGNOSIS — I10 Essential (primary) hypertension: Secondary | ICD-10-CM

## 2018-02-02 DIAGNOSIS — M1712 Unilateral primary osteoarthritis, left knee: Secondary | ICD-10-CM

## 2018-02-02 DIAGNOSIS — E8881 Metabolic syndrome: Secondary | ICD-10-CM | POA: Diagnosis not present

## 2018-02-02 DIAGNOSIS — E785 Hyperlipidemia, unspecified: Secondary | ICD-10-CM | POA: Diagnosis not present

## 2018-02-02 MED ORDER — DICLOFENAC SODIUM 1 % TD GEL: 2.0000 g | Freq: Four times a day (QID) | TRANSDERMAL | 0 refills | Status: DC

## 2018-02-02 NOTE — Patient Instructions (Signed)
Voltaren gel for knee. Continue same meds and RTC in May

## 2018-02-02 NOTE — Progress Notes (Signed)
   Subjective:    Patient ID: Sharon Russell, female    DOB: 10/08/1946, 72 y.o.   MRN: [id number]  HPI 72 year old Black Female for 6 month follow up.  Hx hyperlipidemia and HTN. Impaired glucose tolerance.  LDL decreased from 123 to 841107  Left knee pain -wants to try Voltaren gel.cannot take both Mobic and OTC Ibuprofen. Can take Extra strength Tylenol  tid with Mobic  Hgb AIC 5.9% 6 month ago and was 5.8%  Review of Systems Left knee pain does not want injections    Objective:   Physical Exam  Constitutional: She appears well-developed and well-nourished.  HENT:  Head: Normocephalic and atraumatic.  Neck: Neck supple. No JVD present.  Cardiovascular: Normal rate, regular rhythm and normal heart sounds.  No murmur heard. Pulmonary/Chest: No respiratory distress. She has no wheezes. She has no rales.  Musculoskeletal: She exhibits no edema.  Left knee osteoarthritis  Lymphadenopathy:    She has no cervical adenopathy.  Skin: Skin is warm and dry.  Psychiatric: She has a normal mood and affect. Her behavior is normal. Judgment and thought content normal.   150/82 on arrival  and repeated 140/84. Just got lisinipril HCTZ filled for 90 days. Does not want to change now.       Assessment & Plan:  Left knee osteoarthritis- try Voltaren Gel qid  HTN f/u in May per pt preference  Impaired glucose tolerance- continue diet and exercise  Hyperlipidemia on statin  Plan: follow up in  May. May need Benicar 20/25 for BP control

## 2018-02-20 DIAGNOSIS — H401111 Primary open-angle glaucoma, right eye, mild stage: Secondary | ICD-10-CM | POA: Diagnosis not present

## 2018-02-20 DIAGNOSIS — H401122 Primary open-angle glaucoma, left eye, moderate stage: Secondary | ICD-10-CM | POA: Diagnosis not present

## 2018-04-17 ENCOUNTER — Encounter: Payer: Self-pay | Admitting: Internal Medicine

## 2018-04-17 ENCOUNTER — Ambulatory Visit (INDEPENDENT_AMBULATORY_CARE_PROVIDER_SITE_OTHER): Payer: Medicare HMO | Admitting: Internal Medicine

## 2018-04-17 VITALS — BP 130/70 | HR 75 | Ht 60.75 in | Wt 196.0 lb

## 2018-04-17 DIAGNOSIS — E8881 Metabolic syndrome: Secondary | ICD-10-CM

## 2018-04-17 DIAGNOSIS — E119 Type 2 diabetes mellitus without complications: Secondary | ICD-10-CM

## 2018-04-17 DIAGNOSIS — E785 Hyperlipidemia, unspecified: Secondary | ICD-10-CM | POA: Diagnosis not present

## 2018-04-17 DIAGNOSIS — I10 Essential (primary) hypertension: Secondary | ICD-10-CM | POA: Diagnosis not present

## 2018-04-17 DIAGNOSIS — A084 Viral intestinal infection, unspecified: Secondary | ICD-10-CM | POA: Diagnosis not present

## 2018-04-17 DIAGNOSIS — E869 Volume depletion, unspecified: Secondary | ICD-10-CM

## 2018-04-17 LAB — CBC WITH DIFFERENTIAL/PLATELET
Basophils Absolute: 40 cells/uL (ref 0–200)
Basophils Relative: 0.6 %
EOS PCT: 1.7 %
Eosinophils Absolute: 112 cells/uL (ref 15–500)
HCT: 33.5 % — ABNORMAL LOW (ref 35.0–45.0)
Hemoglobin: 11.3 g/dL — ABNORMAL LOW (ref 11.7–15.5)
LYMPHS ABS: 1406 {cells}/uL (ref 850–3900)
MCH: 27.3 pg (ref 27.0–33.0)
MCHC: 33.7 g/dL (ref 32.0–36.0)
MCV: 80.9 fL (ref 80.0–100.0)
MPV: 11.7 fL (ref 7.5–12.5)
Monocytes Relative: 11.6 %
NEUTROS PCT: 64.8 %
Neutro Abs: 4277 cells/uL (ref 1500–7800)
PLATELETS: 213 10*3/uL (ref 140–400)
RBC: 4.14 10*6/uL (ref 3.80–5.10)
RDW: 12.4 % (ref 11.0–15.0)
Total Lymphocyte: 21.3 %
WBC mixed population: 766 cells/uL (ref 200–950)
WBC: 6.6 10*3/uL (ref 3.8–10.8)

## 2018-04-17 LAB — COMPLETE METABOLIC PANEL WITH GFR
AG Ratio: 1.4 (calc) (ref 1.0–2.5)
ALBUMIN MSPROF: 4.2 g/dL (ref 3.6–5.1)
ALT: 39 U/L — ABNORMAL HIGH (ref 6–29)
AST: 43 U/L — ABNORMAL HIGH (ref 10–35)
Alkaline phosphatase (APISO): 111 U/L (ref 33–130)
BUN / CREAT RATIO: 14 (calc) (ref 6–22)
BUN: 18 mg/dL (ref 7–25)
CALCIUM: 9.9 mg/dL (ref 8.6–10.4)
CO2: 27 mmol/L (ref 20–32)
CREATININE: 1.26 mg/dL — AB (ref 0.60–0.93)
Chloride: 98 mmol/L (ref 98–110)
GFR, EST AFRICAN AMERICAN: 49 mL/min/{1.73_m2} — AB (ref >=60)
GFR, EST NON AFRICAN AMERICAN: 42 mL/min/{1.73_m2} — AB (ref >=60)
GLOBULIN: 3 g/dL (ref 1.9–3.7)
GLUCOSE: 98 mg/dL (ref 65–99)
Potassium: 4.4 mmol/L (ref 3.5–5.3)
Sodium: 136 mmol/L (ref 135–146)
TOTAL PROTEIN: 7.2 g/dL (ref 6.1–8.1)
Total Bilirubin: 0.6 mg/dL (ref 0.2–1.2)

## 2018-04-17 MED ORDER — SIMVASTATIN 10 MG PO TABS: 10.0000 mg | ORAL_TABLET | Freq: Every day | ORAL | 0 refills | Status: DC

## 2018-04-17 MED ORDER — LISINOPRIL-HYDROCHLOROTHIAZIDE 20-25 MG PO TABS: 1.0000 | ORAL_TABLET | Freq: Every day | ORAL | 0 refills | Status: DC

## 2018-04-17 MED ORDER — POTASSIUM CHLORIDE CRYS ER 10 MEQ PO TBCR: 10.0000 meq | EXTENDED_RELEASE_TABLET | Freq: Every day | ORAL | 0 refills | Status: DC

## 2018-04-17 MED ORDER — METOPROLOL TARTRATE 100 MG PO TABS: 100.0000 mg | ORAL_TABLET | Freq: Two times a day (BID) | ORAL | 0 refills | Status: DC

## 2018-04-17 MED ORDER — MELOXICAM 15 MG PO TABS: 15.0000 mg | ORAL_TABLET | Freq: Every day | ORAL | 0 refills | Status: DC

## 2018-04-17 MED ORDER — ONDANSETRON HCL 4 MG PO TABS: 4.0000 mg | ORAL_TABLET | Freq: Three times a day (TID) | ORAL | 0 refills | Status: DC | PRN

## 2018-04-17 NOTE — Patient Instructions (Addendum)
Clear liquids until diarrhea resolves and advance diet slowly. Zofran for nausea. Labs drawn. RTC  Mid june

## 2018-04-17 NOTE — Progress Notes (Signed)
   Subjective:    Patient ID: Sharon Russell, female    DOB: 1946/04/26, 72 y.o.   MRN: [id number]  HPI 72 year old female in today originally for blood pressure check.  Says blood pressure up until recently was running 150s systolically.  However on Friday, May 3 she came down with gastroenteritis symptoms.  She had nausea and diarrhea.  Multiple episodes of diarrhea.  No blood in stool.  Had chills.  No documented fever.  Still feeling chilly today.    Review of Systems see above     Objective:   Physical Exam Tongue is moist.  Chest is clear to auscultation.  Abdomen no hepatosplenomegaly masses or significant tenderness blood pressure is actually on the low side at 130/70 but she is probably volume depleted.       Assessment & Plan:  Viral gastroenteritis  Essential hypertension  Hyperlipidemia  Diabetes mellitus without complication  Suspect volume depletion with gastroenteritis  Plan: She will return in mid June for blood pressure check.  Today is not a good day to make decision about her blood pressure medication.  I have prescribed Zofran 4 mg tablets every 8 hours as needed nausea.  Stay with clear liquids until diarrhea has resolved and advance diet slowly.  Stay out of work until asymptomatic for at least 24 hours.  Note can be provided.  Medications refilled at her request

## 2018-05-16 ENCOUNTER — Other Ambulatory Visit: Payer: Self-pay

## 2018-05-16 MED ORDER — DICLOFENAC SODIUM 1 % TD GEL: 2.0000 g | Freq: Four times a day (QID) | TRANSDERMAL | 99 refills | Status: DC

## 2018-05-17 ENCOUNTER — Other Ambulatory Visit: Payer: Self-pay | Admitting: Internal Medicine

## 2018-05-26 ENCOUNTER — Encounter: Payer: Self-pay | Admitting: Internal Medicine

## 2018-05-26 ENCOUNTER — Ambulatory Visit (INDEPENDENT_AMBULATORY_CARE_PROVIDER_SITE_OTHER): Payer: Medicare HMO | Admitting: Internal Medicine

## 2018-05-26 ENCOUNTER — Other Ambulatory Visit: Payer: Self-pay | Admitting: Internal Medicine

## 2018-05-26 VITALS — BP 120/80 | HR 74 | Temp 98.2°F | Ht 60.75 in | Wt 195.0 lb

## 2018-05-26 DIAGNOSIS — I10 Essential (primary) hypertension: Secondary | ICD-10-CM

## 2018-05-26 NOTE — Progress Notes (Signed)
   Subjective:    Patient ID: Sharon Russell, female    DOB: 04/05/1946, 72 y.o.   MRN: [id number]  HPI in February her blood pressure was elevated in the office at 150/82.  Long-standing history of hypertension requiring multidrug regimen.  Asked patient at the time to return in May but when she came in May she had a viral gastroenteritis. Although her blood pressure was better in May of 130/70 she was ill and I did not think it was wise to make a decision about her blood pressure medication at that time so asked her to return in mid June.  At the time she indicated at home her blood pressures been running in the 150s systolically before contracting the gastroenteritis.   Review of Systems see above today her blood pressure is excellent at 120/80.  She brings in multiple readings and they have all been quite stable.  She has been on Zestoretic 20/25 for some time in addition to metoprolol 100 mg daily, potassium supplement, also have prescribed meloxicam for musculoskeletal pain which could potentially elevate blood pressure.     Objective:   Physical Exam  Neck supple without thyromegaly JVD or carotid bruits.  Chest clear to auscultation.  Cardiac exam regular rate and rhythm.  Extremities without edema.  Blood pressure seems to be under good control at present time with no elevated readings at home or here today.      Assessment & Plan:  Essential hypertension  History of elevated blood pressure readings  Plan: Had give some consideration to changing Prinzide 20/25 to olmesartan HCTZ but will not do that at the present time.  She will return in September for physical examination.

## 2018-06-06 ENCOUNTER — Other Ambulatory Visit: Payer: Self-pay | Admitting: Internal Medicine

## 2018-06-11 NOTE — Patient Instructions (Signed)
Continue current medications to control blood pressure and follow-up in September for physical examination and reevaluation of blood pressure at that time.  Continue to do home monitoring.

## 2018-06-19 ENCOUNTER — Other Ambulatory Visit: Payer: Self-pay | Admitting: Internal Medicine

## 2018-06-23 ENCOUNTER — Telehealth: Payer: Self-pay | Admitting: Internal Medicine

## 2018-06-23 NOTE — Telephone Encounter (Signed)
Faxed Medical records to [facility] for OV 1-1.18 till Present (57 pages) 7.12.19 @2 :00pm

## 2018-07-19 ENCOUNTER — Other Ambulatory Visit: Payer: Self-pay | Admitting: Internal Medicine

## 2018-07-31 ENCOUNTER — Ambulatory Visit (INDEPENDENT_AMBULATORY_CARE_PROVIDER_SITE_OTHER): Payer: Medicare HMO

## 2018-07-31 ENCOUNTER — Ambulatory Visit (INDEPENDENT_AMBULATORY_CARE_PROVIDER_SITE_OTHER): Payer: Medicare HMO | Admitting: Orthopaedic Surgery

## 2018-07-31 ENCOUNTER — Telehealth (INDEPENDENT_AMBULATORY_CARE_PROVIDER_SITE_OTHER): Payer: Self-pay

## 2018-07-31 ENCOUNTER — Encounter (INDEPENDENT_AMBULATORY_CARE_PROVIDER_SITE_OTHER): Payer: Self-pay | Admitting: Orthopaedic Surgery

## 2018-07-31 DIAGNOSIS — G8929 Other chronic pain: Secondary | ICD-10-CM

## 2018-07-31 DIAGNOSIS — M25562 Pain in left knee: Secondary | ICD-10-CM | POA: Diagnosis not present

## 2018-07-31 DIAGNOSIS — M1612 Unilateral primary osteoarthritis, left hip: Secondary | ICD-10-CM | POA: Diagnosis not present

## 2018-07-31 MED ORDER — METHYLPREDNISOLONE ACETATE 40 MG/ML IJ SUSP
40.0000 mg | INTRAMUSCULAR | Status: AC | PRN
  Administered 2018-07-31: 40 mg via INTRA_ARTICULAR

## 2018-07-31 MED ORDER — LIDOCAINE HCL 1 % IJ SOLN
3.0000 mL | INTRAMUSCULAR | Status: AC | PRN
  Administered 2018-07-31: 3 mL

## 2018-07-31 NOTE — Progress Notes (Signed)
Office Visit Note   Patient: Sharon Russell           Date of Birth: 05/31/1946           MRN: [id number] Visit Date: 07/31/2018              Requested by: Margaree MackintoshBaxley, Callista J, MD [address] [place], [place] [id number] PCP: Margaree MackintoshBaxley, Tigerlily J, MD   Assessment & Plan: Visit Diagnoses:  1. Chronic pain of left knee   2. Unilateral primary osteoarthritis, left hip     Plan: She wants to still consider conservative treatment measures and I agree with this.  I talked her about a steroid injection today since she is had this in the past and understands the risk and benefits of this.  She is also a perfect candidate for hyaluronic acid.  She agrees to have this ordered as well.  We will see her back in 4 weeks and hopefully place hyaluronic acid into the left knee.  She tolerated steroid injection well today.  Follow-Up Instructions: Return in about 4 weeks (around 08/28/2018).   Orders:  Orders Placed This Encounter  Procedures  . Large Joint Inj  . XR Knee 1-2 Views Left   No orders of the defined types were placed in this encounter.     Procedures: Large Joint Inj: L knee on 07/31/2018 10:14 AM Indications: diagnostic evaluation and pain Details: 22 G 1.5 in needle, superolateral approach  Arthrogram: No  Medications: 3 mL lidocaine 1 %; 40 mg methylPREDNISolone acetate 40 MG/ML Outcome: tolerated well, no immediate complications Procedure, treatment alternatives, risks and benefits explained, specific risks discussed. Consent was given by the patient. Immediately prior to procedure a time out was called to verify the correct patient, procedure, equipment, support staff and site/side marked as required. Patient was prepped and draped in the usual sterile fashion.       Clinical Data: No additional findings.   Subjective: Chief Complaint  Patient presents with  . Left Knee - Pain  The patient somewhat seen in the past.  She is having worsening left knee pain.  She is had  at least 2 to steroid injections in her left knee but this is been remote.  She says she is worse in the morning in terms of stiffness in that knee and that leg in general.  And pain is mainly in the medial joint line where she points to.  She is never had a hyaluronic acid injection.  She is 72 years old.  She is now working on weight loss and activity modification as well.  HPI  Review of Systems She currently denies any headache, chest pain, shortness of breath, fever, chills, nausea, vomiting.  Objective: Vital Signs: There were no vitals taken for this visit.  Physical Exam She is alert and oriented x3 and in no acute distress Ortho Exam Examination of the left knee shows full range of motion the knee and is ligamentously stable.  There is significant patellofemoral crepitation.  There is slight varus malalignment with significant medial joint line tenderness. Specialty Comments:  No specialty comments available.  Imaging: Xr Knee 1-2 Views Left  Result Date: 07/31/2018 An AP and lateral left knee show significant arthritic changes throughout the knee.  There are tricompartmental changes with medial joint space narrowing.  There is periarticular osteophytes in all 3 compartments and sclerotic changes.  The medial joint spaces almost completely closed.    PMFS History: Patient Active Problem List  Diagnosis Date Noted  . Unilateral primary osteoarthritis, left hip 07/31/2018  . Dependent edema 02/10/2016  . Osteoarthritis of left knee 02/10/2016  . Metabolic syndrome 02/10/2016  . Glaucoma 07/25/2015  . Borderline glaucoma 04/09/2015  . Primary open angle glaucoma 04/09/2015  . Chronic angle-closure glaucoma 02/26/2015  . NS (nuclear sclerosis) 02/05/2015  . Controlled diabetes mellitus type II without complication (HCC) 07/16/2014  . Hyperlipidemia 09/09/2011  . Hypertension 09/09/2011   Past Medical History:  Diagnosis Date  . Arthritis    shoulder  . Hypertension       Family History  Problem Relation Age of Onset  . Diabetes Sister   . Hypertension Sister   . Hypertension Brother   . Diabetes Sister   . Hypertension Sister   . Stroke Sister   . Heart disease Brother     Past Surgical History:  Procedure Laterality Date  . ABDOMINAL HYSTERECTOMY  [id number]  . GALLBLADDER SURGERY  [id number]   Social History   Occupational History  . Not on file  Tobacco Use  . Smoking status: Never Smoker  . Smokeless tobacco: Never Used  Substance and Sexual Activity  . Alcohol use: Yes    Alcohol/week: 1.0 standard drinks    Types: 1 Glasses of wine per week  . Drug use: Not on file  . Sexual activity: Not on file

## 2018-07-31 NOTE — Telephone Encounter (Signed)
Left knee gel injection ?

## 2018-08-04 ENCOUNTER — Telehealth (INDEPENDENT_AMBULATORY_CARE_PROVIDER_SITE_OTHER): Payer: Self-pay

## 2018-08-04 NOTE — Telephone Encounter (Signed)
Noted  

## 2018-08-04 NOTE — Telephone Encounter (Signed)
Submitted VOB for Monovisc, Left Knee. 

## 2018-08-15 ENCOUNTER — Telehealth (INDEPENDENT_AMBULATORY_CARE_PROVIDER_SITE_OTHER): Payer: Self-pay

## 2018-08-15 NOTE — Telephone Encounter (Signed)
Patient is approved for Monovisc, left knee. Buy & Bill Patient will be responsible for 20% OOP. Co-pay of $35.00 No PA required. Appt.scheduled 08/28/2018

## 2018-08-16 ENCOUNTER — Other Ambulatory Visit: Payer: Self-pay | Admitting: Internal Medicine

## 2018-08-16 DIAGNOSIS — H409 Unspecified glaucoma: Secondary | ICD-10-CM

## 2018-08-16 DIAGNOSIS — E785 Hyperlipidemia, unspecified: Secondary | ICD-10-CM

## 2018-08-16 DIAGNOSIS — E119 Type 2 diabetes mellitus without complications: Secondary | ICD-10-CM

## 2018-08-16 DIAGNOSIS — M8580 Other specified disorders of bone density and structure, unspecified site: Secondary | ICD-10-CM

## 2018-08-16 DIAGNOSIS — M1712 Unilateral primary osteoarthritis, left knee: Secondary | ICD-10-CM

## 2018-08-16 DIAGNOSIS — E8881 Metabolic syndrome: Secondary | ICD-10-CM

## 2018-08-16 DIAGNOSIS — Z0000 Encounter for general adult medical examination without abnormal findings: Secondary | ICD-10-CM

## 2018-08-18 ENCOUNTER — Other Ambulatory Visit: Payer: Medicare HMO | Admitting: Internal Medicine

## 2018-08-18 DIAGNOSIS — H409 Unspecified glaucoma: Secondary | ICD-10-CM | POA: Diagnosis not present

## 2018-08-18 DIAGNOSIS — M8580 Other specified disorders of bone density and structure, unspecified site: Secondary | ICD-10-CM

## 2018-08-18 DIAGNOSIS — Z0000 Encounter for general adult medical examination without abnormal findings: Secondary | ICD-10-CM

## 2018-08-18 DIAGNOSIS — E8881 Metabolic syndrome: Secondary | ICD-10-CM | POA: Diagnosis not present

## 2018-08-18 DIAGNOSIS — M1712 Unilateral primary osteoarthritis, left knee: Secondary | ICD-10-CM | POA: Diagnosis not present

## 2018-08-18 DIAGNOSIS — E119 Type 2 diabetes mellitus without complications: Secondary | ICD-10-CM | POA: Diagnosis not present

## 2018-08-18 DIAGNOSIS — E785 Hyperlipidemia, unspecified: Secondary | ICD-10-CM

## 2018-08-19 LAB — COMPLETE METABOLIC PANEL WITH GFR
AG Ratio: 1.7 (calc) (ref 1.0–2.5)
ALT: 10 U/L (ref 6–29)
AST: 13 U/L (ref 10–35)
Albumin: 4.5 g/dL (ref 3.6–5.1)
Alkaline phosphatase (APISO): 97 U/L (ref 33–130)
BUN/Creatinine Ratio: 18 (calc) (ref 6–22)
BUN: 28 mg/dL — AB (ref 7–25)
CALCIUM: 9.9 mg/dL (ref 8.6–10.4)
CO2: 24 mmol/L (ref 20–32)
CREATININE: 1.58 mg/dL — AB (ref 0.60–0.93)
Chloride: 106 mmol/L (ref 98–110)
GFR, EST NON AFRICAN AMERICAN: 32 mL/min/{1.73_m2} — AB (ref >=60)
GFR, Est African American: 37 mL/min/{1.73_m2} — ABNORMAL LOW (ref >=60)
GLUCOSE: 103 mg/dL — AB (ref 65–99)
Globulin: 2.7 g/dL (calc) (ref 1.9–3.7)
Potassium: 4.2 mmol/L (ref 3.5–5.3)
Sodium: 139 mmol/L (ref 135–146)
Total Bilirubin: 0.4 mg/dL (ref 0.2–1.2)
Total Protein: 7.2 g/dL (ref 6.1–8.1)

## 2018-08-19 LAB — CBC WITH DIFFERENTIAL/PLATELET
BASOS PCT: 0.8 %
Basophils Absolute: 57 cells/uL (ref 0–200)
EOS ABS: 298 {cells}/uL (ref 15–500)
Eosinophils Relative: 4.2 %
HEMATOCRIT: 34.4 % — AB (ref 35.0–45.0)
HEMOGLOBIN: 11.3 g/dL — AB (ref 11.7–15.5)
LYMPHS ABS: 3096 {cells}/uL (ref 850–3900)
MCH: 27.7 pg (ref 27.0–33.0)
MCHC: 32.8 g/dL (ref 32.0–36.0)
MCV: 84.3 fL (ref 80.0–100.0)
MPV: 11.4 fL (ref 7.5–12.5)
Monocytes Relative: 8.9 %
NEUTROS ABS: 3018 {cells}/uL (ref 1500–7800)
Neutrophils Relative %: 42.5 %
PLATELETS: 239 10*3/uL (ref 140–400)
RBC: 4.08 10*6/uL (ref 3.80–5.10)
RDW: 13.3 % (ref 11.0–15.0)
Total Lymphocyte: 43.6 %
WBC: 7.1 10*3/uL (ref 3.8–10.8)
WBCMIX: 632 {cells}/uL (ref 200–950)

## 2018-08-19 LAB — LIPID PANEL
CHOL/HDL RATIO: 2.6 (calc) (ref <5.0)
Cholesterol: 231 mg/dL — ABNORMAL HIGH (ref <200)
HDL: 88 mg/dL (ref >50)
LDL Cholesterol (Calc): 123 mg/dL (calc) — ABNORMAL HIGH
Non-HDL Cholesterol (Calc): 143 mg/dL (calc) — ABNORMAL HIGH (ref <130)
Triglycerides: 102 mg/dL (ref <150)

## 2018-08-19 LAB — HEMOGLOBIN A1C
HEMOGLOBIN A1C: 5.6 %{Hb} (ref <5.7)
MEAN PLASMA GLUCOSE: 114 (calc)
eAG (mmol/L): 6.3 (calc)

## 2018-08-19 LAB — MICROALBUMIN / CREATININE URINE RATIO
Creatinine, Urine: 139 mg/dL (ref 20–275)
MICROALB/CREAT RATIO: 32 ug/mg{creat} — AB (ref <30)
Microalb, Ur: 4.4 mg/dL

## 2018-08-19 LAB — TSH: TSH: 0.94 mIU/L (ref 0.40–4.50)

## 2018-08-22 ENCOUNTER — Ambulatory Visit (INDEPENDENT_AMBULATORY_CARE_PROVIDER_SITE_OTHER): Payer: Medicare HMO | Admitting: Internal Medicine

## 2018-08-22 ENCOUNTER — Encounter: Payer: Self-pay | Admitting: Internal Medicine

## 2018-08-22 VITALS — BP 128/70 | HR 60 | Ht 60.75 in | Wt 192.0 lb

## 2018-08-22 DIAGNOSIS — R8290 Unspecified abnormal findings in urine: Secondary | ICD-10-CM | POA: Diagnosis not present

## 2018-08-22 DIAGNOSIS — R7989 Other specified abnormal findings of blood chemistry: Secondary | ICD-10-CM

## 2018-08-22 DIAGNOSIS — Z0000 Encounter for general adult medical examination without abnormal findings: Secondary | ICD-10-CM

## 2018-08-22 DIAGNOSIS — E8881 Metabolic syndrome: Secondary | ICD-10-CM

## 2018-08-22 DIAGNOSIS — H401111 Primary open-angle glaucoma, right eye, mild stage: Secondary | ICD-10-CM

## 2018-08-22 DIAGNOSIS — I10 Essential (primary) hypertension: Secondary | ICD-10-CM

## 2018-08-22 DIAGNOSIS — Z6836 Body mass index (BMI) 36.0-36.9, adult: Secondary | ICD-10-CM

## 2018-08-22 DIAGNOSIS — M1712 Unilateral primary osteoarthritis, left knee: Secondary | ICD-10-CM | POA: Diagnosis not present

## 2018-08-22 DIAGNOSIS — H401122 Primary open-angle glaucoma, left eye, moderate stage: Secondary | ICD-10-CM

## 2018-08-22 DIAGNOSIS — M8580 Other specified disorders of bone density and structure, unspecified site: Secondary | ICD-10-CM

## 2018-08-22 DIAGNOSIS — R799 Abnormal finding of blood chemistry, unspecified: Secondary | ICD-10-CM | POA: Diagnosis not present

## 2018-08-22 DIAGNOSIS — R7302 Impaired glucose tolerance (oral): Secondary | ICD-10-CM

## 2018-08-22 DIAGNOSIS — E7800 Pure hypercholesterolemia, unspecified: Secondary | ICD-10-CM | POA: Diagnosis not present

## 2018-08-22 LAB — POCT URINALYSIS DIPSTICK
Bilirubin, UA: NEGATIVE
GLUCOSE UA: NEGATIVE
Ketones, UA: NEGATIVE
Nitrite, UA: NEGATIVE
Protein, UA: NEGATIVE
RBC UA: NEGATIVE
Spec Grav, UA: 1.01 (ref 1.010–1.025)
Urobilinogen, UA: 0.2 E.U./dL
pH, UA: 6 (ref 5.0–8.0)

## 2018-08-22 LAB — BUN/CREATININE RATIO
BUN/Creatinine Ratio: 16 (calc) (ref 6–22)
BUN: 23 mg/dL (ref 7–25)
CREATININE: 1.43 mg/dL — AB (ref 0.60–0.93)
GFR, EST AFRICAN AMERICAN: 42 mL/min/{1.73_m2} — AB (ref >=60)
GFR, EST NON AFRICAN AMERICAN: 36 mL/min/{1.73_m2} — AB (ref >=60)

## 2018-08-22 NOTE — Patient Instructions (Addendum)
Continue same medications and follow up in 6 months..  Have renal ultrasound and nephrology consult.  To have flu vaccine through employment.

## 2018-08-22 NOTE — Progress Notes (Signed)
Subjective:    Patient ID: Sharon Russell, female    DOB: 1946-08-31, 72 y.o.   MRN: [id number]  HPI 72 year old Black Female for Medicare wellness, health maintenance exam and evaluation of medical issues.  She has been followed here since 04-16-2011 and had hypertension some 7 years prior to that.  She has developed increased serum creatinine.  In May 2019, creatinine was 1.26 and now this month is 1.58.  In August 2018 creatinine was 1.19 and in August 2017 creatinine was 1.06.  In August 2016 creatinine was 0.97 and and August 2018 creatinine was 0.93.   Currently on metoprolol 100 mg twice daily, lisinopril HCTZ 20/25, simvastatin 10 mg daily, K-Dur 20 with normal serum potassium.  Followed here since 04-16-11.  Was noted to have hypertension for 7 years prior to that.  History of abdominal hysterectomy without oophorectomy 1980 for fibroids.  Cholecystectomy 2008/04/15.  She had colonoscopy in 04/15/09 at [facility] was was normal. Next exam due next year.  History of chronic left open angle glaucoma left eye moderate stage and mild stage in right eye followed by Dr. Lottie Dawson at [facility] on [place] Uses Xalatan drops both eyes.  Family history: She is a widow.  Father died of apparent cardiac arrest with history of pacemaker and was 30 years old.  Mother died at age 22 and her sleep.  3 brothers 1 of whom is living.  One died of complications of alcoholism with history of seizure disorder.  Another brother died at age 28 of an MI.  Patient had 3 sisters, 1 of whom died with a stroke.  2 sisters are living.  Mother had history of diabetes and is just 1 of her sisters.  Mother had hypertension.  One brother and one sister with hypertension.  Apparently mother had glaucoma.  Social history: Her husband passed away in early 04-15-16 of a sudden MI.  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  Patient has regular eye exams  with Dr. Lottie Dawson in the last one was earlier this year.  Please see notes from [facility] in [place].  Has abnormal urinalysis and some urinary issues and will be treated with low-dose Cipro 250 mg twice daily for 10 days with culture pending.  Addendum: Has E. coli UTI sensitive to Cipro and will need follow-up urinalysis in a couple of weeks.  History of diet-controlled diabetes and hemoglobin A1c excellent at 5.6%.  Bone density study at [facility] 2015 Lowest T score -1.5. Last mammogram October 2018.    Review of Systems saw Dr Magnus Ivan recently for arthritis in knee injected with steroid in August. Lost 8 pounds since February.     Objective:   Physical Exam  Constitutional: She appears well-developed and well-nourished. No distress.  HENT:  Head: Normocephalic and atraumatic.  Right Ear: External ear normal.  Left Ear: External ear normal.  Mouth/Throat: Oropharynx is clear and moist. No oropharyngeal exudate.  Eyes: Pupils are equal, round, and reactive to light. EOM are normal.  Cardiovascular: Normal rate, regular rhythm and normal heart sounds.  Skin: She is not diaphoretic.  Vitals reviewed.  BP excellent at 128/70.       Assessment & Plan:  Elevated serum creatinine-to have ultrasound of kidneys and will be referred to Nephrology.  Has been taking meloxicam for osteoarthritis in her knees.  Long-standing history  of hypertension.  History of impaired glucose tolerance diet-controlled.  Diet-controlled glucose intolerance-hemoglobin A1c is excellent  Hypertension-stable on current regimen at 128/70  Pure hypercholesterolemia  Metabolic syndrome  Osteoarthritis of knees seen by Dr. Magnus Ivan and had recent steroid treatments  Bilateral chronic open angle glaucoma treated with Xalatan  BMI 36.58 continue with diet and exercise  Health maintenance- has had Prevnar 13 and Pneumococcal 23 vaccines.  Tdap is up-to-date.  Plan: To have renal  ultrasound and nephrology consultation follow-up in 6 months.  Subjective:   Patient presents for Medicare Annual/Subsequent preventive examination.  Review Past Medical/Family/Social: See above  Risk Factors  Current exercise habits: Exercises with job.  Tries to walk some otherwise. Dietary issues discussed: Low-fat low carbohydrate and low sodium  Cardiac risk factors: Hyperlipidemia, hypertension, impaired glucose tolerance, family history of apparent cardiac arrest and pacemaker in father.  One brother died of an MI.  One sister died of a stroke.  Depression Screen  (Note: if answer to either of the following is "Yes", a more complete depression screening is indicated)   Over the past two weeks, have you felt down, depressed or hopeless? No  Over the past two weeks, have you felt little interest or pleasure in doing things? No Have you lost interest or pleasure in daily life? No Do you often feel hopeless? No Do you cry easily over simple problems? No   Activities of Daily Living  In your present state of health, do you have any difficulty performing the following activities?:   Driving? No  Managing money? No  Feeding yourself? No  Getting from bed to chair? No  Climbing a flight of stairs? No  Preparing food and eating?: No  Bathing or showering? No  Getting dressed: No  Getting to the toilet? No  Using the toilet:No  Moving around from place to place: No  In the past year have you fallen or had a near fall?:No  Are you sexually active? No  Do you have more than one partner? No   Hearing Difficulties: No  Do you often ask people to speak up or repeat themselves? No  Do you experience ringing or noises in your ears? No  Do you have difficulty understanding soft or whispered voices? No  Do you feel that you have a problem with memory? No Do you often misplace items? No    Home Safety:  Do you have a smoke alarm at your residence? Yes Do you have grab bars in the  bathroom?  Yes  Do you have throw rugs in your house?  No  Cognitive Testing  Alert? Yes Normal Appearance?Yes  Oriented to person? Yes Place? Yes  Time? Yes  Recall of three objects? Yes  Can perform simple calculations? Yes  Displays appropriate judgment?Yes  Can read the correct time from a watch face?Yes   List the Names of Other Physician/Practitioners you currently use:  See referral list for the physicians patient is currently seeing.  Dr. Dorrene German  Dr. Kristian Covey   Review of Systems: See above   Objective:     General appearance: Appears stated age and  obese  Head: Normocephalic, without obvious abnormality, atraumatic  Eyes: conj clear, EOMi PEERLA  Ears: normal TM's and external ear canals both ears  Nose: Nares normal. Septum midline. Mucosa normal. No drainage or sinus tenderness.  Throat: lips, mucosa, and tongue normal; teeth and gums normal  Neck: no adenopathy, no carotid bruit, no JVD, supple, symmetrical, trachea midline and  thyroid not enlarged, symmetric, no tenderness/mass/nodules  No CVA tenderness.  Lungs: clear to auscultation bilaterally  Breasts: normal appearance, no masses or tenderness, Heart: regular rate and rhythm, S1, S2 normal, no murmur, click, rub or gallop Abdomen: soft, non-tender; bowel sounds normal; no masses, no organomegaly  Musculoskeletal: ROM normal in all joints, no crepitus, no deformity, Normal muscle strengthen. Back  is symmetric, no curvature. Skin: Skin color, texture, turgor normal. No rashes or lesions  Lymph nodes: Cervical, supraclavicular, and axillary nodes normal.  Neurologic: CN 2 -12 Normal, Normal symmetric reflexes. Normal coordination and gait  Psych: Alert & Oriented x 3, Mood appear stable.    Assessment:    Annual wellness medicare exam   Plan:    During the course of the visit the patient was educated and counseled about appropriate screening and preventive services including:     Gets flu vaccine through employment     Patient Instructions (the written plan) was given to the patient.  Medicare Attestation  I have personally reviewed:  The patient's medical and social history  Their use of alcohol, tobacco or illicit drugs  Their current medications and supplements  The patient's functional ability including ADLs,fall risks, home safety risks, cognitive, and hearing and visual impairment  Diet and physical activities  Evidence for depression or mood disorders  The patient's weight, height, BMI, and visual acuity have been recorded in the chart. I have made referrals, counseling, and provided education to the patient based on review of the above and I have provided the patient with a written personalized care plan for preventive services.

## 2018-08-24 ENCOUNTER — Other Ambulatory Visit: Payer: Self-pay

## 2018-08-24 LAB — URINE CULTURE
MICRO NUMBER: 91081566
SPECIMEN QUALITY: ADEQUATE

## 2018-08-24 MED ORDER — CIPROFLOXACIN HCL 250 MG PO TABS: 250.0000 mg | ORAL_TABLET | Freq: Two times a day (BID) | ORAL | 0 refills | Status: DC

## 2018-08-28 ENCOUNTER — Encounter (INDEPENDENT_AMBULATORY_CARE_PROVIDER_SITE_OTHER): Payer: Self-pay | Admitting: Orthopaedic Surgery

## 2018-08-28 ENCOUNTER — Ambulatory Visit (INDEPENDENT_AMBULATORY_CARE_PROVIDER_SITE_OTHER): Payer: Medicare HMO | Admitting: Orthopaedic Surgery

## 2018-08-28 DIAGNOSIS — M1712 Unilateral primary osteoarthritis, left knee: Secondary | ICD-10-CM | POA: Diagnosis not present

## 2018-08-28 MED ORDER — HYALURONAN 88 MG/4ML IX SOSY
88.0000 mg | PREFILLED_SYRINGE | INTRA_ARTICULAR | Status: AC | PRN
  Administered 2018-08-28: 88 mg via INTRA_ARTICULAR

## 2018-08-28 NOTE — Progress Notes (Signed)
   Procedure Note  Patient: Sharon Russell             Date of Birth: 05/19/1946           MRN: [id number]             Visit Date: 08/28/2018  Procedures: Visit Diagnoses: Unilateral primary osteoarthritis, left knee  Large Joint Inj: L knee on 08/28/2018 10:50 AM Indications: diagnostic evaluation and pain Details: 22 G 1.5 in needle, superolateral approach  Arthrogram: No  Medications: 88 mg Hyaluronan 88 MG/4ML Outcome: tolerated well, no immediate complications Procedure, treatment alternatives, risks and benefits explained, specific risks discussed. Consent was given by the patient. Immediately prior to procedure a time out was called to verify the correct patient, procedure, equipment, support staff and site/side marked as required. Patient was prepped and draped in the usual sterile fashion.    The patient is here today for scheduled hyaluronic acid injection in her left knee with Monovisc.  This is to treat the pain from known moderate osteoarthritis of her knee.  She is already had a steroid injection.  On exam there is no knee joint effusion with painful range of motion of the knee.  She is moderately obese.  She tolerated the Monovisc injection in her left knee well.  All questions concerns were answered and addressed.  Follow-up will be as needed.  She understands we can always repeat a steroid injection in 3 months and a hyaluronic acid injection in 6 months.

## 2018-09-02 DIAGNOSIS — H401130 Primary open-angle glaucoma, bilateral, stage unspecified: Secondary | ICD-10-CM | POA: Insufficient documentation

## 2018-09-02 DIAGNOSIS — R7302 Impaired glucose tolerance (oral): Secondary | ICD-10-CM | POA: Insufficient documentation

## 2018-09-02 DIAGNOSIS — M8580 Other specified disorders of bone density and structure, unspecified site: Secondary | ICD-10-CM | POA: Insufficient documentation

## 2018-09-02 DIAGNOSIS — R7989 Other specified abnormal findings of blood chemistry: Secondary | ICD-10-CM | POA: Insufficient documentation

## 2018-09-06 ENCOUNTER — Ambulatory Visit (INDEPENDENT_AMBULATORY_CARE_PROVIDER_SITE_OTHER): Payer: Medicare HMO | Admitting: Internal Medicine

## 2018-09-06 ENCOUNTER — Encounter: Payer: Self-pay | Admitting: Internal Medicine

## 2018-09-06 VITALS — BP 130/70 | HR 80 | Temp 98.2°F | Resp 16 | Ht 60.75 in | Wt 192.0 lb

## 2018-09-06 DIAGNOSIS — N390 Urinary tract infection, site not specified: Secondary | ICD-10-CM

## 2018-09-06 DIAGNOSIS — R8290 Unspecified abnormal findings in urine: Secondary | ICD-10-CM | POA: Diagnosis not present

## 2018-09-06 DIAGNOSIS — B9620 Unspecified Escherichia coli [E. coli] as the cause of diseases classified elsewhere: Secondary | ICD-10-CM | POA: Diagnosis not present

## 2018-09-06 LAB — POCT URINALYSIS DIPSTICK
Appearance: NEGATIVE
Bilirubin, UA: NEGATIVE
Blood, UA: NEGATIVE
Glucose, UA: NEGATIVE
KETONES UA: NEGATIVE
Leukocytes, UA: NEGATIVE
NITRITE UA: NEGATIVE
ODOR: NEGATIVE
PROTEIN UA: NEGATIVE
Spec Grav, UA: 1.015 (ref 1.010–1.025)
Urobilinogen, UA: 0.2 E.U./dL
pH, UA: 6 (ref 5.0–8.0)

## 2018-09-06 NOTE — Patient Instructions (Signed)
UTI resolved.

## 2018-09-06 NOTE — Progress Notes (Signed)
Patient ID: Sharon Russell, female   DOB: [birth year], 72 y.o.   MRN: [id number]   CMA visit to check s/p treatment for UTI (E. Coli) shows urine dipstick to be normal today.

## 2018-09-11 ENCOUNTER — Ambulatory Visit
Admission: RE | Admit: 2018-09-11 | Discharge: 2018-09-11 | Disposition: A | Discharge status: Home / Self Care | Payer: Medicare HMO | Source: Ambulatory Visit | Attending: Internal Medicine | Admitting: Internal Medicine

## 2018-09-11 DIAGNOSIS — R7989 Other specified abnormal findings of blood chemistry: Secondary | ICD-10-CM

## 2018-09-11 DIAGNOSIS — N1339 Other hydronephrosis: Secondary | ICD-10-CM | POA: Diagnosis not present

## 2018-09-19 ENCOUNTER — Other Ambulatory Visit: Payer: Self-pay | Admitting: Internal Medicine

## 2018-09-28 ENCOUNTER — Telehealth: Payer: Self-pay | Admitting: Internal Medicine

## 2018-09-28 NOTE — Telephone Encounter (Signed)
Fax received from [place] stating that Dr. Annie Sable suggests patient STOP Mobic/Meloxicam 15mg .  They will notify once appointment is scheduled.    Called patient to advised that Dr. Kathrene Bongo would like for her to discontinue using the Meloxicam 15mg .  Advised that this is apparently not good for her kidneys at this time.  Patient verbalized understanding.  She asked if she could use Tylenol?  I said it is ok to use Tylenol until she can see Dr. Kathrene Bongo and further discuss what else may be used.    Advised patient that they will call her with an appointment date/time.  Patient verbalized understanding of our conversation.

## 2018-10-04 DIAGNOSIS — H401122 Primary open-angle glaucoma, left eye, moderate stage: Secondary | ICD-10-CM | POA: Diagnosis not present

## 2018-10-04 DIAGNOSIS — H40033 Anatomical narrow angle, bilateral: Secondary | ICD-10-CM | POA: Diagnosis not present

## 2018-10-04 DIAGNOSIS — H401111 Primary open-angle glaucoma, right eye, mild stage: Secondary | ICD-10-CM | POA: Diagnosis not present

## 2018-10-07 DIAGNOSIS — H40033 Anatomical narrow angle, bilateral: Secondary | ICD-10-CM | POA: Insufficient documentation

## 2018-11-01 ENCOUNTER — Other Ambulatory Visit: Payer: Self-pay | Admitting: Internal Medicine

## 2018-11-02 ENCOUNTER — Encounter: Payer: Self-pay | Admitting: Internal Medicine

## 2018-11-02 DIAGNOSIS — Z1231 Encounter for screening mammogram for malignant neoplasm of breast: Secondary | ICD-10-CM | POA: Diagnosis not present

## 2018-11-02 DIAGNOSIS — Z803 Family history of malignant neoplasm of breast: Secondary | ICD-10-CM | POA: Diagnosis not present

## 2018-11-15 DIAGNOSIS — H2513 Age-related nuclear cataract, bilateral: Secondary | ICD-10-CM | POA: Diagnosis not present

## 2018-11-15 DIAGNOSIS — H401130 Primary open-angle glaucoma, bilateral, stage unspecified: Secondary | ICD-10-CM | POA: Diagnosis not present

## 2018-11-20 DIAGNOSIS — N183 Chronic kidney disease, stage 3 (moderate): Secondary | ICD-10-CM | POA: Diagnosis not present

## 2018-11-20 DIAGNOSIS — N1330 Unspecified hydronephrosis: Secondary | ICD-10-CM | POA: Diagnosis not present

## 2018-11-20 DIAGNOSIS — I129 Hypertensive chronic kidney disease with stage 1 through stage 4 chronic kidney disease, or unspecified chronic kidney disease: Secondary | ICD-10-CM | POA: Diagnosis not present

## 2018-11-20 DIAGNOSIS — R7302 Impaired glucose tolerance (oral): Secondary | ICD-10-CM | POA: Diagnosis not present

## 2018-12-18 ENCOUNTER — Other Ambulatory Visit: Payer: Self-pay | Admitting: Internal Medicine

## 2018-12-19 ENCOUNTER — Other Ambulatory Visit: Payer: Self-pay | Admitting: Internal Medicine

## 2018-12-20 ENCOUNTER — Other Ambulatory Visit: Payer: Self-pay

## 2018-12-20 DIAGNOSIS — R7302 Impaired glucose tolerance (oral): Secondary | ICD-10-CM

## 2018-12-20 MED ORDER — GLUCOSE BLOOD VI STRP: ORAL_STRIP | 11 refills | Status: DC

## 2019-01-22 DIAGNOSIS — H40033 Anatomical narrow angle, bilateral: Secondary | ICD-10-CM | POA: Diagnosis not present

## 2019-01-22 DIAGNOSIS — H401122 Primary open-angle glaucoma, left eye, moderate stage: Secondary | ICD-10-CM | POA: Diagnosis not present

## 2019-01-22 DIAGNOSIS — H401111 Primary open-angle glaucoma, right eye, mild stage: Secondary | ICD-10-CM | POA: Diagnosis not present

## 2019-02-16 ENCOUNTER — Other Ambulatory Visit: Payer: Medicare HMO | Admitting: Internal Medicine

## 2019-02-19 NOTE — Addendum Note (Signed)
Addended by: Gregery Na on: 02/19/2019 04:38 PM   Modules accepted: Orders

## 2019-02-20 ENCOUNTER — Ambulatory Visit: Payer: Medicare HMO | Admitting: Internal Medicine

## 2019-02-22 ENCOUNTER — Other Ambulatory Visit: Payer: Medicare HMO | Admitting: Internal Medicine

## 2019-02-22 ENCOUNTER — Other Ambulatory Visit: Payer: Self-pay

## 2019-02-22 DIAGNOSIS — E7800 Pure hypercholesterolemia, unspecified: Secondary | ICD-10-CM | POA: Diagnosis not present

## 2019-02-22 DIAGNOSIS — R7302 Impaired glucose tolerance (oral): Secondary | ICD-10-CM | POA: Diagnosis not present

## 2019-02-22 DIAGNOSIS — I10 Essential (primary) hypertension: Secondary | ICD-10-CM | POA: Diagnosis not present

## 2019-02-22 NOTE — Addendum Note (Signed)
Addended by: Gregery Na on: 02/22/2019 10:35 AM   Modules accepted: Orders

## 2019-02-23 LAB — HEPATIC FUNCTION PANEL
AG Ratio: 1.7 (calc) (ref 1.0–2.5)
ALT: 9 U/L (ref 6–29)
AST: 15 U/L (ref 10–35)
Albumin: 4.5 g/dL (ref 3.6–5.1)
Alkaline phosphatase (APISO): 94 U/L (ref 37–153)
Bilirubin, Direct: 0.1 mg/dL (ref 0.0–0.2)
Globulin: 2.6 g/dL (calc) (ref 1.9–3.7)
Indirect Bilirubin: 0.4 mg/dL (calc) (ref 0.2–1.2)
Total Bilirubin: 0.5 mg/dL (ref 0.2–1.2)
Total Protein: 7.1 g/dL (ref 6.1–8.1)

## 2019-02-23 LAB — HEMOGLOBIN A1C
Hgb A1c MFr Bld: 5.8 % of total Hgb — ABNORMAL HIGH (ref <5.7)
Mean Plasma Glucose: 120 (calc)
eAG (mmol/L): 6.6 (calc)

## 2019-02-23 LAB — LIPID PANEL
Cholesterol: 227 mg/dL — ABNORMAL HIGH (ref <200)
HDL: 98 mg/dL (ref >=50)
LDL Cholesterol (Calc): 113 mg/dL (calc) — ABNORMAL HIGH
Non-HDL Cholesterol (Calc): 129 mg/dL (calc) (ref <130)
Total CHOL/HDL Ratio: 2.3 (calc) (ref <5.0)
Triglycerides: 72 mg/dL (ref <150)

## 2019-02-23 LAB — BASIC METABOLIC PANEL
BUN/Creatinine Ratio: 22 (calc) (ref 6–22)
BUN: 22 mg/dL (ref 7–25)
CO2: 26 mmol/L (ref 20–32)
Calcium: 10.2 mg/dL (ref 8.6–10.4)
Chloride: 104 mmol/L (ref 98–110)
Creat: 1.01 mg/dL — ABNORMAL HIGH (ref 0.60–0.93)
Glucose, Bld: 96 mg/dL (ref 65–99)
POTASSIUM: 4.5 mmol/L (ref 3.5–5.3)
Sodium: 139 mmol/L (ref 135–146)

## 2019-03-01 ENCOUNTER — Ambulatory Visit (INDEPENDENT_AMBULATORY_CARE_PROVIDER_SITE_OTHER): Payer: Medicare HMO | Admitting: Internal Medicine

## 2019-03-01 ENCOUNTER — Encounter: Payer: Self-pay | Admitting: Internal Medicine

## 2019-03-01 ENCOUNTER — Other Ambulatory Visit: Payer: Self-pay

## 2019-03-01 VITALS — BP 160/90 | HR 67 | Ht 60.75 in | Wt 193.0 lb

## 2019-03-01 DIAGNOSIS — E8881 Metabolic syndrome: Secondary | ICD-10-CM

## 2019-03-01 DIAGNOSIS — E7800 Pure hypercholesterolemia, unspecified: Secondary | ICD-10-CM

## 2019-03-01 DIAGNOSIS — R7302 Impaired glucose tolerance (oral): Secondary | ICD-10-CM | POA: Diagnosis not present

## 2019-03-01 DIAGNOSIS — R7989 Other specified abnormal findings of blood chemistry: Secondary | ICD-10-CM | POA: Diagnosis not present

## 2019-03-01 DIAGNOSIS — R030 Elevated blood-pressure reading, without diagnosis of hypertension: Secondary | ICD-10-CM

## 2019-03-01 DIAGNOSIS — I10 Essential (primary) hypertension: Secondary | ICD-10-CM

## 2019-03-01 NOTE — Patient Instructions (Signed)
Collect blood pressure readings at home for 5 days in a row and call with report.  Watch diet.  Get some exercise daily.  Follow-up in 6 months.  No change in medications today

## 2019-03-01 NOTE — Progress Notes (Signed)
   Subjective:    Patient ID: Sharon Russell, female    DOB: 09-Apr-1946, 73 y.o.   MRN: [id number]  HPI-73 year-old Black Female in today for 20-month recheck.  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  Has been eating more sweets recently.  Not exercising much.  Blood pressure is elevated slightly today.  Has some anxiety over the virus outbreak.  Have recommended she check her blood pressure at home daily for 5 days and call me with results.  Hemoglobin A1c is 5.8%.  Total cholesterol 227 and LDL cholesterol 113.  She is on statin medication.  This is about the same as last time September 2019.  Does not want to increase her dose of statin.  Prefers not to change her antihypertensive medication.  She needs to watch salt and get some exercise.  Creatinine is stable at 1.01.  Sodium and potassium are normal.    Review of Systems see above     Objective:   Physical Exam Weight 193 pounds.  Vital signs reviewed.  Skin warm and dry.  Nodes none.  Neck is supple.  Chest clear.  Cardiac exam regular rate and rhythm normal S1 and S2.  Extremities without edema.       Assessment & Plan:  Essential hypertension-blood pressure elevated on my reading at 150/90  Mild anxiety due to viral outbreak in [place]  Impaired glucose tolerance-hemoglobin A1c stable at 5.8%-continue diet and exercise  Pure hypercholesterolemia-continue current dose of statin Zocor 10 mg daily  History of elevated serum creatinine- serum creatinine is stable at 1.01 and previously in September 2019 was 1.43  Plan: Continue current medications.  Call with 5 days of blood pressure readings in the next couple of weeks.  Get exercise.  Watch salt intake.  Watch carbohydrate intake.  Follow-up in September 2020 for physical examination.

## 2019-03-12 ENCOUNTER — Telehealth: Payer: Self-pay

## 2019-03-12 DIAGNOSIS — R7302 Impaired glucose tolerance (oral): Secondary | ICD-10-CM

## 2019-03-12 DIAGNOSIS — E118 Type 2 diabetes mellitus with unspecified complications: Secondary | ICD-10-CM

## 2019-03-12 MED ORDER — ACCU-CHEK AVIVA VI SOLN: 0 refills | Status: AC

## 2019-03-12 MED ORDER — ACCU-CHEK SOFTCLIX LANCETS MISC: 11 refills | Status: DC

## 2019-03-12 MED ORDER — LISINOPRIL-HYDROCHLOROTHIAZIDE 20-25 MG PO TABS: 1.0000 | ORAL_TABLET | Freq: Every day | ORAL | 1 refills | Status: DC

## 2019-03-12 MED ORDER — POTASSIUM CHLORIDE ER 10 MEQ PO TBCR: 10.0000 meq | EXTENDED_RELEASE_TABLET | Freq: Every day | ORAL | 1 refills | Status: DC

## 2019-03-12 MED ORDER — BD SWAB SINGLE USE REGULAR PADS: MEDICATED_PAD | 11 refills | Status: DC

## 2019-03-12 MED ORDER — METOPROLOL TARTRATE 100 MG PO TABS: 100.0000 mg | ORAL_TABLET | Freq: Two times a day (BID) | ORAL | 1 refills | Status: DC

## 2019-03-12 NOTE — Addendum Note (Signed)
Addended by: Gregery Na on: 03/12/2019 04:01 PM   Modules accepted: Orders

## 2019-03-12 NOTE — Telephone Encounter (Signed)
This is fine- have her check it 3 times a week. No change in meds at this time.

## 2019-03-12 NOTE — Telephone Encounter (Signed)
Patient called with BP readings she said it has been fluctuating between 130/85 and 120/75.

## 2019-03-12 NOTE — Telephone Encounter (Signed)
Left detailed message.   

## 2019-03-19 ENCOUNTER — Telehealth: Payer: Self-pay | Admitting: Internal Medicine

## 2019-03-19 ENCOUNTER — Other Ambulatory Visit: Payer: Self-pay

## 2019-03-19 DIAGNOSIS — R7302 Impaired glucose tolerance (oral): Secondary | ICD-10-CM

## 2019-03-19 MED ORDER — GLUCOSE BLOOD VI STRP: ORAL_STRIP | 11 refills | Status: DC

## 2019-03-19 MED ORDER — ACCU-CHEK AVIVA PLUS W/DEVICE KIT: PACK | 0 refills | Status: DC

## 2019-03-19 MED ORDER — SIMVASTATIN 10 MG PO TABS: 10.0000 mg | ORAL_TABLET | Freq: Every day | ORAL | 3 refills | Status: DC

## 2019-03-19 NOTE — Telephone Encounter (Signed)
Faxed Medication refill request  [employer]   simvastatin (ZOCOR) 10 MG tablet   Prescribed - 11.20.19 Last office visit - 03/01/19 Last CPE 08-22-18

## 2019-05-10 ENCOUNTER — Other Ambulatory Visit: Payer: Self-pay | Admitting: Internal Medicine

## 2019-06-14 ENCOUNTER — Other Ambulatory Visit: Payer: Self-pay

## 2019-06-18 ENCOUNTER — Other Ambulatory Visit: Payer: Self-pay

## 2019-06-18 ENCOUNTER — Ambulatory Visit (INDEPENDENT_AMBULATORY_CARE_PROVIDER_SITE_OTHER): Payer: Medicare HMO | Admitting: Orthopaedic Surgery

## 2019-06-18 ENCOUNTER — Encounter: Payer: Self-pay | Admitting: Orthopaedic Surgery

## 2019-06-18 ENCOUNTER — Telehealth: Payer: Self-pay

## 2019-06-18 DIAGNOSIS — M25562 Pain in left knee: Secondary | ICD-10-CM | POA: Diagnosis not present

## 2019-06-18 DIAGNOSIS — G8929 Other chronic pain: Secondary | ICD-10-CM | POA: Diagnosis not present

## 2019-06-18 DIAGNOSIS — M1712 Unilateral primary osteoarthritis, left knee: Secondary | ICD-10-CM

## 2019-06-18 MED ORDER — METHYLPREDNISOLONE ACETATE 40 MG/ML IJ SUSP
40.0000 mg | INTRAMUSCULAR | Status: AC | PRN
  Administered 2019-06-18: 40 mg via INTRA_ARTICULAR

## 2019-06-18 MED ORDER — LIDOCAINE HCL 1 % IJ SOLN
3.0000 mL | INTRAMUSCULAR | Status: AC | PRN
  Administered 2019-06-18: 3 mL

## 2019-06-18 NOTE — Telephone Encounter (Signed)
Left knee gel injection ?

## 2019-06-18 NOTE — Progress Notes (Signed)
Office Visit Note   Patient: Sharon Russell           Date of Birth: 1946/12/13           MRN: [id number] Visit Date: 06/18/2019              Requested by: Elby Showers, MD [address] [place],  [place] [address] PCP: Elby Showers, MD   Assessment & Plan: Visit Diagnoses:  1. Unilateral primary osteoarthritis, left knee   2. Chronic pain of left knee     Plan: I agree with her that her treatment plan should consist of a steroid injection today in her left knee followed by a hyaluronic acid injection in 4 to 5 weeks from now.  This regimen has helped her greatly in the past.  This is to treat the pain from moderate osteoarthritis.  All questions concerns were answered and addressed.  She tolerated steroid injection well today.  We will see her back at her next visit for hyaluronic acid in her left knee.  Follow-Up Instructions: Return in about 5 weeks (around 07/23/2019).   Orders:  Orders Placed This Encounter  Procedures  . Large Joint Inj   No orders of the defined types were placed in this encounter.     Procedures: Large Joint Inj: L knee on 06/18/2019 4:08 PM Indications: diagnostic evaluation and pain Details: 22 G 1.5 in needle, superolateral approach  Arthrogram: No  Medications: 3 mL lidocaine 1 %; 40 mg methylPREDNISolone acetate 40 MG/ML Outcome: tolerated well, no immediate complications Procedure, treatment alternatives, risks and benefits explained, specific risks discussed. Consent was given by the patient. Immediately prior to procedure a time out was called to verify the correct patient, procedure, equipment, support staff and site/side marked as required. Patient was prepped and draped in the usual sterile fashion.       Clinical Data: No additional findings.   Subjective: Chief Complaint  Patient presents with  . Left Knee - Pain  The patient is well-known to me.  She has significant arthritis in her left knee.  Is been 10 months since  she had a hyaluronic acid injection in her left knee and that is worked up until last month.  She has no other change in her medical status.  She is nondiabetic.  She would like to consider injections again to treat her osteoarthritis and she has done so well with the last injection.  Her left knee is achy pain is worse with mobility and does hurt at night as well.  She denies any locking catching.  HPI  Review of Systems She currently denies any headache, chest pain, shortness of breath, fever, chills, nausea, vomiting  Objective: Vital Signs: There were no vitals taken for this visit.  Physical Exam She is alert and orient x3 and in no acute distress Ortho Exam Examination of her left knee shows patellofemoral crepitation and medial joint line tenderness.  The knee has slight varus malalignment that is easily correctable.  She does have good range of motion of her left knee and it feels ligamentously stable Specialty Comments:  No specialty comments available.  Imaging: No results found.   PMFS History: Patient Active Problem List   Diagnosis Date Noted  . Impaired glucose tolerance 09/02/2018  . Chronic open angle glaucoma of both eyes 09/02/2018  . Osteopenia 09/02/2018  . Elevated serum creatinine 09/02/2018  . Unilateral primary osteoarthritis, left knee 08/28/2018  . Unilateral primary osteoarthritis, left hip 07/31/2018  .  Dependent edema 02/10/2016  . Metabolic syndrome 02/10/2016  . NS (nuclear sclerosis) 02/05/2015  . Hyperlipidemia 09/09/2011  . Hypertension 09/09/2011   Past Medical History:  Diagnosis Date  . Arthritis    shoulder  . Hypertension     Family History  Problem Relation Age of Onset  . Diabetes Sister   . Hypertension Sister   . Hypertension Brother   . Diabetes Sister   . Hypertension Sister   . Stroke Sister   . Heart disease Brother     Past Surgical History:  Procedure Laterality Date  . ABDOMINAL HYSTERECTOMY  [id number]  .  GALLBLADDER SURGERY  [id number]   Social History   Occupational History  . Not on file  Tobacco Use  . Smoking status: Never Smoker  . Smokeless tobacco: Never Used  Substance and Sexual Activity  . Alcohol use: Yes    Alcohol/week: 1.0 standard drinks    Types: 1 Glasses of wine per week  . Drug use: Not on file  . Sexual activity: Not on file

## 2019-06-21 NOTE — Telephone Encounter (Signed)
Noted  

## 2019-06-22 ENCOUNTER — Telehealth: Payer: Self-pay

## 2019-06-22 NOTE — Telephone Encounter (Signed)
Submitted VOB for Monovisc, left knee. 

## 2019-06-29 ENCOUNTER — Telehealth: Payer: Self-pay

## 2019-06-29 NOTE — Telephone Encounter (Signed)
Approved for Monovisc, left knee . [place] Patient will be responsible for 20% OOP. No Co-pay No PA required per [facility] with [facility] Reference# [id number]/29/2019  Appt. 07/23/2019 with Dr. Ninfa Linden  Talked with [facility] with [facility] and she stated that No PA is required for [id number](Monovisc)

## 2019-07-14 ENCOUNTER — Other Ambulatory Visit: Payer: Self-pay | Admitting: Internal Medicine

## 2019-07-18 DIAGNOSIS — H2513 Age-related nuclear cataract, bilateral: Secondary | ICD-10-CM | POA: Diagnosis not present

## 2019-07-18 DIAGNOSIS — H402231 Chronic angle-closure glaucoma, bilateral, mild stage: Secondary | ICD-10-CM | POA: Diagnosis not present

## 2019-07-18 DIAGNOSIS — H40033 Anatomical narrow angle, bilateral: Secondary | ICD-10-CM | POA: Diagnosis not present

## 2019-07-21 ENCOUNTER — Other Ambulatory Visit: Payer: Self-pay | Admitting: Internal Medicine

## 2019-07-23 ENCOUNTER — Ambulatory Visit (INDEPENDENT_AMBULATORY_CARE_PROVIDER_SITE_OTHER): Payer: Medicare HMO | Admitting: Orthopaedic Surgery

## 2019-07-23 ENCOUNTER — Encounter: Payer: Self-pay | Admitting: Orthopaedic Surgery

## 2019-07-23 DIAGNOSIS — M1712 Unilateral primary osteoarthritis, left knee: Secondary | ICD-10-CM | POA: Diagnosis not present

## 2019-07-23 MED ORDER — HYALURONAN 88 MG/4ML IX SOSY
88.0000 mg | PREFILLED_SYRINGE | INTRA_ARTICULAR | Status: AC | PRN
  Administered 2019-07-23: 88 mg via INTRA_ARTICULAR

## 2019-07-23 NOTE — Progress Notes (Signed)
   Procedure Note  Patient: Sharon Russell             Date of Birth: 03-18-1946           MRN: [id number]             Visit Date: 07/23/2019  Procedures: Visit Diagnoses:  1. Unilateral primary osteoarthritis, left knee     Large Joint Inj: L knee on 07/23/2019 9:51 AM Indications: diagnostic evaluation and pain Details: 22 G 1.5 in needle, superolateral approach  Arthrogram: No  Medications: 88 mg Hyaluronan 88 MG/4ML Outcome: tolerated well, no immediate complications Procedure, treatment alternatives, risks and benefits explained, specific risks discussed. Consent was given by the patient. Immediately prior to procedure a time out was called to verify the correct patient, procedure, equipment, support staff and site/side marked as required. Patient was prepped and draped in the usual sterile fashion.    The patient is here today to treat the pain from osteoarthritis in her left knee with a hyaluronic acid injection.  This will be with Monovisc.  She is tried and failed other forms of conservative treatment including even steroid injections.  She is worked on activity modification and weight loss as well as anti-inflammatories.  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  She understands why we are proceeding with this type of injection.  We talked about the risk and benefits of it as well.  She did tolerate the injection well.  All question concerns were answered and addressed.  Follow-up will be as needed.

## 2019-08-04 ENCOUNTER — Encounter: Payer: Self-pay | Admitting: Gastroenterology

## 2019-08-23 ENCOUNTER — Other Ambulatory Visit: Payer: Medicare HMO | Admitting: Internal Medicine

## 2019-08-23 ENCOUNTER — Other Ambulatory Visit: Payer: Self-pay

## 2019-08-23 DIAGNOSIS — R7989 Other specified abnormal findings of blood chemistry: Secondary | ICD-10-CM | POA: Diagnosis not present

## 2019-08-23 DIAGNOSIS — I10 Essential (primary) hypertension: Secondary | ICD-10-CM

## 2019-08-23 DIAGNOSIS — E785 Hyperlipidemia, unspecified: Secondary | ICD-10-CM

## 2019-08-23 DIAGNOSIS — N183 Chronic kidney disease, stage 3 (moderate): Secondary | ICD-10-CM | POA: Diagnosis not present

## 2019-08-23 DIAGNOSIS — M1712 Unilateral primary osteoarthritis, left knee: Secondary | ICD-10-CM | POA: Diagnosis not present

## 2019-08-23 DIAGNOSIS — R7302 Impaired glucose tolerance (oral): Secondary | ICD-10-CM | POA: Diagnosis not present

## 2019-08-23 DIAGNOSIS — Z0000 Encounter for general adult medical examination without abnormal findings: Secondary | ICD-10-CM | POA: Diagnosis not present

## 2019-08-23 DIAGNOSIS — E8881 Metabolic syndrome: Secondary | ICD-10-CM | POA: Diagnosis not present

## 2019-08-23 DIAGNOSIS — E7800 Pure hypercholesterolemia, unspecified: Secondary | ICD-10-CM | POA: Diagnosis not present

## 2019-08-23 DIAGNOSIS — D649 Anemia, unspecified: Secondary | ICD-10-CM | POA: Diagnosis not present

## 2019-08-24 ENCOUNTER — Other Ambulatory Visit: Payer: Self-pay

## 2019-08-24 DIAGNOSIS — D649 Anemia, unspecified: Secondary | ICD-10-CM

## 2019-08-25 LAB — CBC WITH DIFFERENTIAL/PLATELET
Absolute Monocytes: 499 cells/uL (ref 200–950)
Basophils Absolute: 52 cells/uL (ref 0–200)
Basophils Relative: 1 %
Eosinophils Absolute: 359 cells/uL (ref 15–500)
Eosinophils Relative: 6.9 %
HCT: 32.7 % — ABNORMAL LOW (ref 35.0–45.0)
Hemoglobin: 10.9 g/dL — ABNORMAL LOW (ref 11.7–15.5)
Lymphs Abs: 1841 cells/uL (ref 850–3900)
MCH: 28.5 pg (ref 27.0–33.0)
MCHC: 33.3 g/dL (ref 32.0–36.0)
MCV: 85.6 fL (ref 80.0–100.0)
MPV: 10.8 fL (ref 7.5–12.5)
Monocytes Relative: 9.6 %
Neutro Abs: 2449 cells/uL (ref 1500–7800)
Neutrophils Relative %: 47.1 %
Platelets: 210 10*3/uL (ref 140–400)
RBC: 3.82 10*6/uL (ref 3.80–5.10)
RDW: 13.4 % (ref 11.0–15.0)
Total Lymphocyte: 35.4 %
WBC: 5.2 10*3/uL (ref 3.8–10.8)

## 2019-08-25 LAB — IRON, TOTAL/TOTAL IRON BINDING CAP
%SAT: 21 % (calc) (ref 16–45)
Iron: 61 ug/dL (ref 45–160)
TIBC: 293 mcg/dL (calc) (ref 250–450)

## 2019-08-25 LAB — TEST AUTHORIZATION

## 2019-08-25 LAB — COMPLETE METABOLIC PANEL WITH GFR
AG Ratio: 1.7 (calc) (ref 1.0–2.5)
ALT: 11 U/L (ref 6–29)
AST: 15 U/L (ref 10–35)
Albumin: 4.2 g/dL (ref 3.6–5.1)
Alkaline phosphatase (APISO): 82 U/L (ref 37–153)
BUN/Creatinine Ratio: 16 (calc) (ref 6–22)
BUN: 16 mg/dL (ref 7–25)
CO2: 25 mmol/L (ref 20–32)
Calcium: 9.5 mg/dL (ref 8.6–10.4)
Chloride: 106 mmol/L (ref 98–110)
Creat: 0.97 mg/dL — ABNORMAL HIGH (ref 0.60–0.93)
GFR, Est African American: 66 mL/min/{1.73_m2} (ref >=60)
GFR, Est Non African American: 57 mL/min/{1.73_m2} — ABNORMAL LOW (ref >=60)
Globulin: 2.5 g/dL (calc) (ref 1.9–3.7)
Glucose, Bld: 97 mg/dL (ref 65–99)
Potassium: 4.6 mmol/L (ref 3.5–5.3)
Sodium: 140 mmol/L (ref 135–146)
Total Bilirubin: 0.4 mg/dL (ref 0.2–1.2)
Total Protein: 6.7 g/dL (ref 6.1–8.1)

## 2019-08-25 LAB — FOLATE: Folate: >24 ng/mL

## 2019-08-25 LAB — LIPID PANEL
Cholesterol: 249 mg/dL — ABNORMAL HIGH (ref <200)
HDL: 101 mg/dL (ref >=50)
LDL Cholesterol (Calc): 129 mg/dL (calc) — ABNORMAL HIGH
Non-HDL Cholesterol (Calc): 148 mg/dL (calc) — ABNORMAL HIGH (ref <130)
Total CHOL/HDL Ratio: 2.5 (calc) (ref <5.0)
Triglycerides: 91 mg/dL (ref <150)

## 2019-08-25 LAB — RETICULOCYTES
ABS Retic: 50310 cells/uL (ref 20000–8000)
Retic Ct Pct: 1.3 %

## 2019-08-25 LAB — HEMOGLOBIN A1C
Hgb A1c MFr Bld: 5.5 % of total Hgb (ref <5.7)
Mean Plasma Glucose: 111 (calc)
eAG (mmol/L): 6.2 (calc)

## 2019-08-25 LAB — FERRITIN: Ferritin: 149 ng/mL (ref 16–288)

## 2019-08-25 LAB — TSH: TSH: 1.2 mIU/L (ref 0.40–4.50)

## 2019-08-25 LAB — VITAMIN B12: Vitamin B-12: 315 pg/mL (ref 200–1100)

## 2019-08-28 ENCOUNTER — Encounter: Payer: Self-pay | Admitting: Internal Medicine

## 2019-08-28 ENCOUNTER — Ambulatory Visit (INDEPENDENT_AMBULATORY_CARE_PROVIDER_SITE_OTHER): Payer: Medicare HMO | Admitting: Internal Medicine

## 2019-08-28 ENCOUNTER — Other Ambulatory Visit: Payer: Self-pay

## 2019-08-28 VITALS — BP 140/80 | HR 65 | Ht 60.75 in | Wt 199.0 lb

## 2019-08-28 DIAGNOSIS — R7989 Other specified abnormal findings of blood chemistry: Secondary | ICD-10-CM

## 2019-08-28 DIAGNOSIS — I10 Essential (primary) hypertension: Secondary | ICD-10-CM | POA: Diagnosis not present

## 2019-08-28 DIAGNOSIS — D649 Anemia, unspecified: Secondary | ICD-10-CM | POA: Diagnosis not present

## 2019-08-28 DIAGNOSIS — R7302 Impaired glucose tolerance (oral): Secondary | ICD-10-CM | POA: Diagnosis not present

## 2019-08-28 DIAGNOSIS — E8881 Metabolic syndrome: Secondary | ICD-10-CM | POA: Diagnosis not present

## 2019-08-28 DIAGNOSIS — N183 Chronic kidney disease, stage 3 unspecified: Secondary | ICD-10-CM

## 2019-08-28 DIAGNOSIS — H401111 Primary open-angle glaucoma, right eye, mild stage: Secondary | ICD-10-CM | POA: Diagnosis not present

## 2019-08-28 DIAGNOSIS — Z0000 Encounter for general adult medical examination without abnormal findings: Secondary | ICD-10-CM

## 2019-08-28 DIAGNOSIS — M1712 Unilateral primary osteoarthritis, left knee: Secondary | ICD-10-CM

## 2019-08-28 DIAGNOSIS — Z23 Encounter for immunization: Secondary | ICD-10-CM

## 2019-08-28 DIAGNOSIS — E7800 Pure hypercholesterolemia, unspecified: Secondary | ICD-10-CM

## 2019-08-28 LAB — POCT URINALYSIS DIPSTICK
Appearance: NEGATIVE
Bilirubin, UA: NEGATIVE
Blood, UA: NEGATIVE
Glucose, UA: NEGATIVE
Ketones, UA: NEGATIVE
Leukocytes, UA: NEGATIVE
Nitrite, UA: NEGATIVE
Odor: NEGATIVE
Protein, UA: NEGATIVE
Spec Grav, UA: 1.015 (ref 1.010–1.025)
Urobilinogen, UA: 0.2 E.U./dL
pH, UA: 6.5 (ref 5.0–8.0)

## 2019-08-28 NOTE — Progress Notes (Signed)
Subjective:    Patient ID: Sharon Russell, female    DOB: 06/19/1946, 73 y.o.   MRN: [id number]  HPI 73 year old Female for health maintenance exam, evaluation of medical issues, and Medicare wellness annual visit.  She has been followed here since 2012.  Has been hypertensive for some 7 years prior to that.  Has developed increased serum creatinine likely due to chronic kidney disease.  She is on metoprolol 100 mg twice daily, lisinopril HCTZ, simvastatin K-Dur 20.  History of abdominal hysterectomy without oophorectomy 1980 for fibroids.  Cholecystectomy 2009.  She had colonoscopy at [facility] in 2010 which was normal and repeat study is due this year.  History of chronic left open-angle glaucoma moderate stage and glaucoma mild stage in right eye followed [facility].  He has an office of [address] [place].  She uses Xalatan drops in both eyes.  History of E. coli UTI 2019.  History of diet-controlled diabetes and hemoglobin A1c checked is excellent at 5.5%.  Hemoglobin 10.9 g and previously last year was 11.3 g.  Her anemia work-up is normal and I am going to repeat her CBC in 3 months.  Recommend multivitamin with iron.  Needs to have colonoscopy.  [facility] lowest T score -1.5.  Social history: Husband passed away in early 2017 of a sudden MI.  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  Family history: Father died of apparent cardiac arrest with history of pacemaker and was 75 years old.  Mother died at age 75 in her sleep.  3 brothers, 1 of whom is living.  One died of complications of alcoholism with history of seizure disorder.  Another brother died at age 75 of an MI.  Patient had 3 sisters 1 of whom died with a stroke.  2 sisters are living.  Mother had history of diabetes and hypertension.  One brother and one sister with  hypertension.  History of osteoarthritis of her knees and is seeing Dr. Magnus IvanBlackman for knee injections.  Review of Systems -no new complaints    Objective:   Physical Exam Vitals signs reviewed.  Constitutional:      General: She is not in acute distress.    Appearance: Normal appearance.  HENT:     Head: Normocephalic and atraumatic.     Right Ear: Tympanic membrane normal.     Left Ear: Tympanic membrane normal.     Nose: Nose normal.     Mouth/Throat:     Mouth: Mucous membranes are moist.     Pharynx: Oropharynx is clear.  Eyes:     General: No scleral icterus.    Conjunctiva/sclera: Conjunctivae normal.     Pupils: Pupils are equal, round, and reactive to light.  Neck:     Musculoskeletal: Neck supple. No neck rigidity.     Vascular: No carotid bruit.  Cardiovascular:     Rate and Rhythm: Normal rate and regular rhythm.     Pulses: Normal pulses.     Heart sounds: No murmur.  Pulmonary:     Effort: Pulmonary effort is normal.     Breath sounds: Normal breath sounds. No wheezing or rales.  Abdominal:     General: Bowel sounds are normal. There is no distension.     Palpations: Abdomen is soft.     Tenderness: There  is no abdominal tenderness. There is no guarding or rebound.  Musculoskeletal:        General: No deformity.     Right lower leg: No edema.     Left lower leg: No edema.  Lymphadenopathy:     Cervical: No cervical adenopathy.  Skin:    General: Skin is warm and dry.     Findings: No rash.  Neurological:     General: No focal deficit present.     Mental Status: She is alert and oriented to person, place, and time.     Cranial Nerves: No cranial nerve deficit.     Sensory: No sensory deficit.     Motor: No weakness.     Gait: Gait normal.  Psychiatric:        Mood and Affect: Mood normal.        Behavior: Behavior normal.        Thought Content: Thought content normal.        Judgment: Judgment normal.    BP 140/80 Has gained 7 pounds.  BMI is  37       Assessment & Plan:  Impaired glucose tolerance-stable on current regimen  Elevated serum creatinine--creatinine is now normal and was normal in March.  A year ago it was 1.43.  She has a history of right-sided hydronephrosis longstanding since at least 2009.  She was referred to [facility].  Mobic was discontinued and creatinine improved.  Essential hypertension-stable  BMI 37-really needs to work on diet exercise and weight loss.  Bilateral chronic open-angle glaucoma [facility]  Osteoarthritis of knees seen by Dr. Magnus Ivan and treated with steroid injections  Metabolic syndrome  Hyperlipidemia-pure hypercholesterolemia  Impaired glucose tolerance-stable  Plan: Continue current medications.  Try to diet exercise and lose weight and follow-up in 6 months.  Subjective:   Patient presents for Medicare Annual/Subsequent preventive examination.  Review Past Medical/Family/Social: See above   Risk Factors  Current exercise habits: Needs to get more exercise Dietary issues discussed: Low-fat low carbohydrate  Cardiac risk factors: Hyperlipidemia, impaired glucose tolerance, family history  Depression Screen  (Note: if answer to either of the following is "Yes", a more complete depression screening is indicated)   Over the past two weeks, have you felt down, depressed or hopeless? No  Over the past two weeks, have you felt little interest or pleasure in doing things? No Have you lost interest or pleasure in daily life? No Do you often feel hopeless? No Do you cry easily over simple problems? No   Activities of Daily Living  In your present state of health, do you have any difficulty performing the following activities?:   Driving? No  Managing money? No  Feeding yourself? No  Getting from bed to chair? No  Climbing a flight of stairs? No  Preparing food and eating?: No  Bathing or  showering? No  Getting dressed: No  Getting to the toilet? No  Using the toilet:No  Moving around from place to place: No  In the past year have you fallen or had a near fall?:No  Are you sexually active? No  Do you have more than one partner? No   Hearing Difficulties: No  Do you often ask people to speak up or repeat themselves? No  Do you experience ringing or noises in your ears? No  Do you have difficulty understanding soft or whispered voices? No  Do you feel that you  have a problem with memory? No Do you often misplace items? No    Home Safety:  Do you have a smoke alarm at your residence? Yes Do you have grab bars in the bathroom?  Yes Do you have throw rugs in your house?  None   Cognitive Testing  Alert? Yes Normal Appearance?Yes  Oriented to person? Yes Place? Yes  Time? Yes  Recall of three objects? Yes  Can perform simple calculations? Yes  Displays appropriate judgment?Yes  Can read the correct time from a watch face?Yes   List the Names of Other Physician/Practitioners you currently use:  See referral list for the physicians patient is currently seeing.     Review of Systems: See above   Objective:     General appearance: Appears stated age and  obese  Head: Normocephalic, without obvious abnormality, atraumatic  Eyes: conj clear, EOMi PEERLA  Ears: normal TM's and external ear canals both ears  Nose: Nares normal. Septum midline. Mucosa normal. No drainage or sinus tenderness.  Throat: lips, mucosa, and tongue normal; teeth and gums normal  Neck: no adenopathy, no carotid bruit, no JVD, supple, symmetrical, trachea midline and thyroid not enlarged, symmetric, no tenderness/mass/nodules  No CVA tenderness.  Lungs: clear to auscultation bilaterally  Breasts: normal appearance, no masses or tenderness Heart: regular rate and rhythm, S1, S2 normal, no murmur, click, rub or gallop  Abdomen: soft, non-tender; bowel sounds normal; no masses, no  organomegaly  Musculoskeletal: ROM normal in all joints, no crepitus, no deformity, Normal muscle strengthen. Back  is symmetric, no curvature. Skin: Skin color, texture, turgor normal. No rashes or lesions  Lymph nodes: Cervical, supraclavicular, and axillary nodes normal.  Neurologic: CN 2 -12 Normal, Normal symmetric reflexes. Normal coordination and gait  Psych: Alert & Oriented x 3, Mood appear stable.    Assessment:    Annual wellness medicare exam   Plan:    During the course of the visit the patient was educated and counseled about appropriate screening and preventive services including:   Annual mammogram  Annual flu vaccine     Patient Instructions (the written plan) was given to the patient.  Medicare Attestation  I have personally reviewed:  The patient's medical and social history  Their use of alcohol, tobacco or illicit drugs  Their current medications and supplements  The patient's functional ability including ADLs,fall risks, home safety risks, cognitive, and hearing and visual impairment  Diet and physical activities  Evidence for depression or mood disorders  The patient's weight, height, BMI, and visual acuity have been recorded in the chart. I have made referrals, counseling, and provided education to the patient based on review of the above and I have provided the patient with a written personalized care plan for preventive services.

## 2019-09-11 NOTE — Patient Instructions (Signed)
It was a pleasure to see you today.  We have found that you are mildly anemic.  Please follow-up with [place] in December.  Please try to watch diet, exercise and lose some weight.

## 2019-11-15 DIAGNOSIS — R7302 Impaired glucose tolerance (oral): Secondary | ICD-10-CM | POA: Diagnosis not present

## 2019-11-15 DIAGNOSIS — I129 Hypertensive chronic kidney disease with stage 1 through stage 4 chronic kidney disease, or unspecified chronic kidney disease: Secondary | ICD-10-CM | POA: Diagnosis not present

## 2019-11-15 DIAGNOSIS — N1330 Unspecified hydronephrosis: Secondary | ICD-10-CM | POA: Diagnosis not present

## 2019-11-15 DIAGNOSIS — N183 Chronic kidney disease, stage 3 unspecified: Secondary | ICD-10-CM | POA: Diagnosis not present

## 2019-11-23 ENCOUNTER — Other Ambulatory Visit: Payer: Self-pay

## 2019-11-23 ENCOUNTER — Other Ambulatory Visit: Payer: Medicare HMO | Admitting: Internal Medicine

## 2019-11-23 DIAGNOSIS — M1712 Unilateral primary osteoarthritis, left knee: Secondary | ICD-10-CM | POA: Diagnosis not present

## 2019-11-23 DIAGNOSIS — D509 Iron deficiency anemia, unspecified: Secondary | ICD-10-CM | POA: Diagnosis not present

## 2019-11-23 DIAGNOSIS — R7302 Impaired glucose tolerance (oral): Secondary | ICD-10-CM | POA: Diagnosis not present

## 2019-11-23 DIAGNOSIS — D649 Anemia, unspecified: Secondary | ICD-10-CM | POA: Diagnosis not present

## 2019-11-23 DIAGNOSIS — I10 Essential (primary) hypertension: Secondary | ICD-10-CM | POA: Diagnosis not present

## 2019-11-24 LAB — CBC WITH DIFFERENTIAL/PLATELET
Absolute Monocytes: 451 cells/uL (ref 200–950)
Basophils Absolute: 59 cells/uL (ref 0–200)
Basophils Relative: 1.2 %
Eosinophils Absolute: 319 cells/uL (ref 15–500)
Eosinophils Relative: 6.5 %
HCT: 34.4 % — ABNORMAL LOW (ref 35.0–45.0)
Hemoglobin: 11.6 g/dL — ABNORMAL LOW (ref 11.7–15.5)
Lymphs Abs: 2078 cells/uL (ref 850–3900)
MCH: 29.2 pg (ref 27.0–33.0)
MCHC: 33.7 g/dL (ref 32.0–36.0)
MCV: 86.6 fL (ref 80.0–100.0)
MPV: 11.1 fL (ref 7.5–12.5)
Monocytes Relative: 9.2 %
Neutro Abs: 1994 cells/uL (ref 1500–7800)
Neutrophils Relative %: 40.7 %
Platelets: 217 10*3/uL (ref 140–400)
RBC: 3.97 10*6/uL (ref 3.80–5.10)
RDW: 12.3 % (ref 11.0–15.0)
Total Lymphocyte: 42.4 %
WBC: 4.9 10*3/uL (ref 3.8–10.8)

## 2019-11-24 LAB — IRON, TOTAL/TOTAL IRON BINDING CAP
%SAT: 24 % (calc) (ref 16–45)
Iron: 73 ug/dL (ref 45–160)
TIBC: 304 mcg/dL (calc) (ref 250–450)

## 2019-11-24 LAB — VITAMIN B12: Vitamin B-12: 890 pg/mL (ref 200–1100)

## 2019-11-27 ENCOUNTER — Other Ambulatory Visit: Payer: Self-pay

## 2019-11-27 ENCOUNTER — Ambulatory Visit (INDEPENDENT_AMBULATORY_CARE_PROVIDER_SITE_OTHER): Payer: Medicare HMO | Admitting: Internal Medicine

## 2019-11-27 ENCOUNTER — Encounter: Payer: Self-pay | Admitting: Internal Medicine

## 2019-11-27 VITALS — BP 130/70 | HR 69 | Temp 98.0°F | Ht 60.75 in | Wt 200.0 lb

## 2019-11-27 DIAGNOSIS — R7302 Impaired glucose tolerance (oral): Secondary | ICD-10-CM | POA: Diagnosis not present

## 2019-11-27 DIAGNOSIS — I10 Essential (primary) hypertension: Secondary | ICD-10-CM

## 2019-11-27 DIAGNOSIS — D649 Anemia, unspecified: Secondary | ICD-10-CM

## 2019-11-27 NOTE — Progress Notes (Addendum)
   Subjective:    Patient ID: Sharon Russell, female    DOB: September 25, 1946, 73 y.o.   MRN: [id number]  HPI 73 year old Female for follow up on mild anemia. At last visit which was  her annual CPE visit in [place] was anemic with hemoglobin at 10.9 grams with low normal MCV.  Also her iron was 61 which was low normal.   Now  Hgb has improved to 11.6 grams with iron supplementation.  Her iron level has improved to 73.  B12 level is normal. Had colonoscopy 2010.  Due to her age I have suggested Cologard test and will order it.   Review of Systems no new complaints but does have ostearthritis left knee which she has had injected but limits exercise due to pain. Needs to walk some and lose weight.     Objective:   Physical Exam BP 130/70 Chest clear to auscultation.  Cardiac exam regular rate and rhythm.  Normal S1 and S2.  Extremities without edema.      Assessment & Plan:  Iron deficiency anemia improved with iron supplementation  Anemia has improved with iron supplementation which she should continue.  Cologuard test ordered.  Addendum: Cologuard test is negative.  Spoke with patient personally on January 30.

## 2019-11-29 DIAGNOSIS — Z803 Family history of malignant neoplasm of breast: Secondary | ICD-10-CM | POA: Diagnosis not present

## 2019-11-29 DIAGNOSIS — Z1231 Encounter for screening mammogram for malignant neoplasm of breast: Secondary | ICD-10-CM | POA: Diagnosis not present

## 2019-11-29 LAB — HM MAMMOGRAPHY

## 2019-12-13 ENCOUNTER — Other Ambulatory Visit: Payer: Self-pay | Admitting: Internal Medicine

## 2019-12-17 ENCOUNTER — Other Ambulatory Visit: Payer: Self-pay

## 2019-12-17 DIAGNOSIS — Z1211 Encounter for screening for malignant neoplasm of colon: Secondary | ICD-10-CM

## 2019-12-17 DIAGNOSIS — D509 Iron deficiency anemia, unspecified: Secondary | ICD-10-CM

## 2019-12-20 NOTE — Addendum Note (Signed)
Addended by: Gregery Na on: 12/20/2019 10:39 AM   Modules accepted: Orders

## 2019-12-21 ENCOUNTER — Encounter: Payer: Self-pay | Admitting: Internal Medicine

## 2019-12-25 ENCOUNTER — Telehealth: Payer: Self-pay | Admitting: Internal Medicine

## 2019-12-25 NOTE — Progress Notes (Signed)
She received today

## 2019-12-25 NOTE — Telephone Encounter (Signed)
Staff is to call her and see if she got Cologard kit

## 2020-01-01 DIAGNOSIS — Z1211 Encounter for screening for malignant neoplasm of colon: Secondary | ICD-10-CM | POA: Diagnosis not present

## 2020-01-03 ENCOUNTER — Other Ambulatory Visit: Payer: Self-pay | Admitting: Internal Medicine

## 2020-01-05 LAB — EXTERNAL GENERIC LAB PROCEDURE: COLOGUARD: NEGATIVE

## 2020-01-05 LAB — COLOGUARD: COLOGUARD: NEGATIVE

## 2020-01-12 NOTE — Patient Instructions (Addendum)
Iron deficiency anemia improved with iron supplementation which she should continue.  Cologuard test ordered.  Follow-up in March.

## 2020-01-16 ENCOUNTER — Ambulatory Visit: Payer: Medicare HMO | Admitting: Orthopaedic Surgery

## 2020-01-21 ENCOUNTER — Ambulatory Visit (INDEPENDENT_AMBULATORY_CARE_PROVIDER_SITE_OTHER): Payer: Medicare HMO

## 2020-01-21 ENCOUNTER — Ambulatory Visit: Payer: Medicare HMO | Admitting: Physician Assistant

## 2020-01-21 ENCOUNTER — Encounter: Payer: Self-pay | Admitting: Physician Assistant

## 2020-01-21 ENCOUNTER — Other Ambulatory Visit: Payer: Self-pay

## 2020-01-21 DIAGNOSIS — M25562 Pain in left knee: Secondary | ICD-10-CM | POA: Diagnosis not present

## 2020-01-21 DIAGNOSIS — M1712 Unilateral primary osteoarthritis, left knee: Secondary | ICD-10-CM | POA: Diagnosis not present

## 2020-01-21 MED ORDER — LIDOCAINE HCL 1 % IJ SOLN
3.0000 mL | INTRAMUSCULAR | Status: AC | PRN
  Administered 2020-01-21: 3 mL

## 2020-01-21 MED ORDER — METHYLPREDNISOLONE ACETATE 40 MG/ML IJ SUSP
40.0000 mg | INTRAMUSCULAR | Status: AC | PRN
  Administered 2020-01-21: 40 mg via INTRA_ARTICULAR

## 2020-01-21 NOTE — Progress Notes (Signed)
Office Visit Note   Patient: Sharon Russell           Date of Birth: [birth year]           MRN: [id number] Visit Date: 01/21/2020              Requested by: Elby Showers, MD [address] [place],  [place] [address] PCP: Elby Showers, MD   Assessment & Plan: Visit Diagnoses:  1. Primary osteoarthritis of left knee     Plan: She will continue to work on quad strengthening.  She understands she needs to wait at least 6 months between supplemental injections which she could have at this point time and 3 months between cortisone injections.  Questions encouraged and answered.  Follow-up as needed.  She will let us know if she would like to proceed with the supplemental injection in the future.  Follow-Up Instructions: Return if symptoms worsen or fail to improve.   Orders:  Orders Placed This Encounter  Procedures  . Large Joint Inj: L knee  . XR Knee 1-2 Views Left   No orders of the defined types were placed in this encounter.     Procedures: Large Joint Inj: L knee on 01/21/2020 11:07 AM Indications: pain Details: 22 G 1.5 in needle, anterolateral approach  Arthrogram: No  Medications: 3 mL lidocaine 1 %; 40 mg methylPREDNISolone acetate 40 MG/ML Outcome: tolerated well, no immediate complications Procedure, treatment alternatives, risks and benefits explained, specific risks discussed. Consent was given by the patient. Immediately prior to procedure a time out was called to verify the correct patient, procedure, equipment, support staff and site/side marked as required. Patient was prepped and draped in the usual sterile fashion.       Clinical Data: No additional findings.   Subjective: Chief Complaint  Patient presents with  . Left Knee - Pain    HPI Sharon Russell is a 74 year old female comes in today with left knee pain for the past 3 months no known injury.  Pains worse with going up and down stairs.  She ambulates with a cane.  She has known  tricompartmental arthritis left knee.  She has had no known injury.  Most of pain is anterior aspect of the knee.  She notes stiffness whenever she first gets up in the morning. Review of Systems Negative for fevers chills shortness of breath chest pain  Objective: Vital Signs: There were no vitals taken for this visit.  Physical Exam General: Well-developed well-nourished female no acute distress.  Ambulates with a cane with a nonantalgic gait. Psych: Alert and oriented x3 Ortho Exam Left knee: Good range of motion.  No abnormal warmth erythema or effusion. Specialty Comments:  No specialty comments available.  Imaging: XR Knee 1-2 Views Left  Result Date: 01/21/2020 AP left knee and lateral view: No acute fracture.  Knee is well located.  Tricompartmental changes with severe patellofemoral changes.  Moderately severe medial compartmental changes.  Osteophytes of the lateral aspect of the distal femur and proximal tibia.    PMFS History: Patient Active Problem List   Diagnosis Date Noted  . Impaired glucose tolerance 09/02/2018  . Chronic open angle glaucoma of both eyes 09/02/2018  . Osteopenia 09/02/2018  . Elevated serum creatinine 09/02/2018  . Unilateral primary osteoarthritis, left knee 08/28/2018  . Unilateral primary osteoarthritis, left hip 07/31/2018  . Dependent edema 02/10/2016  . Metabolic syndrome 56/43/3295  . NS (nuclear sclerosis) 02/05/2015  . Hyperlipidemia 09/09/2011  . Hypertension  09/09/2011   Past Medical History:  Diagnosis Date  . Arthritis    shoulder  . Hypertension     Family History  Problem Relation Age of Onset  . Diabetes Sister   . Hypertension Sister   . Hypertension Brother   . Diabetes Sister   . Hypertension Sister   . Stroke Sister   . Heart disease Brother     Past Surgical History:  Procedure Laterality Date  . ABDOMINAL HYSTERECTOMY  [id number]  . GALLBLADDER SURGERY  [id number]   Social History   Occupational History    . Not on file  Tobacco Use  . Smoking status: Never Smoker  . Smokeless tobacco: Never Used  Substance and Sexual Activity  . Alcohol use: Yes    Alcohol/week: 1.0 standard drinks    Types: 1 Glasses of wine per week  . Drug use: Not on file  . Sexual activity: Not on file

## 2020-01-23 DIAGNOSIS — H402231 Chronic angle-closure glaucoma, bilateral, mild stage: Secondary | ICD-10-CM | POA: Diagnosis not present

## 2020-01-23 DIAGNOSIS — H40033 Anatomical narrow angle, bilateral: Secondary | ICD-10-CM | POA: Diagnosis not present

## 2020-01-23 DIAGNOSIS — H524 Presbyopia: Secondary | ICD-10-CM | POA: Diagnosis not present

## 2020-01-23 DIAGNOSIS — H2513 Age-related nuclear cataract, bilateral: Secondary | ICD-10-CM | POA: Diagnosis not present

## 2020-02-12 DIAGNOSIS — Z0100 Encounter for examination of eyes and vision without abnormal findings: Secondary | ICD-10-CM | POA: Diagnosis not present

## 2020-02-22 ENCOUNTER — Other Ambulatory Visit: Payer: Medicare HMO | Admitting: Internal Medicine

## 2020-02-22 ENCOUNTER — Other Ambulatory Visit: Payer: Self-pay

## 2020-02-22 DIAGNOSIS — E7800 Pure hypercholesterolemia, unspecified: Secondary | ICD-10-CM

## 2020-02-22 DIAGNOSIS — R7302 Impaired glucose tolerance (oral): Secondary | ICD-10-CM

## 2020-02-23 LAB — HEMOGLOBIN A1C
Hgb A1c MFr Bld: 5.8 % of total Hgb — ABNORMAL HIGH (ref <5.7)
Mean Plasma Glucose: 120 (calc)
eAG (mmol/L): 6.6 (calc)

## 2020-02-23 LAB — LIPID PANEL
Cholesterol: 246 mg/dL — ABNORMAL HIGH (ref <200)
HDL: 108 mg/dL (ref >=50)
LDL Cholesterol (Calc): 119 mg/dL (calc) — ABNORMAL HIGH
Non-HDL Cholesterol (Calc): 138 mg/dL (calc) — ABNORMAL HIGH (ref <130)
Total CHOL/HDL Ratio: 2.3 (calc) (ref <5.0)
Triglycerides: 88 mg/dL (ref <150)

## 2020-02-26 ENCOUNTER — Encounter: Payer: Self-pay | Admitting: Internal Medicine

## 2020-02-26 ENCOUNTER — Ambulatory Visit (INDEPENDENT_AMBULATORY_CARE_PROVIDER_SITE_OTHER): Payer: Medicare HMO | Admitting: Internal Medicine

## 2020-02-26 ENCOUNTER — Other Ambulatory Visit: Payer: Self-pay

## 2020-02-26 VITALS — BP 130/70 | HR 68 | Temp 97.9°F | Ht 60.75 in | Wt 200.0 lb

## 2020-02-26 DIAGNOSIS — I10 Essential (primary) hypertension: Secondary | ICD-10-CM | POA: Diagnosis not present

## 2020-02-26 DIAGNOSIS — R7302 Impaired glucose tolerance (oral): Secondary | ICD-10-CM | POA: Diagnosis not present

## 2020-02-26 DIAGNOSIS — E7800 Pure hypercholesterolemia, unspecified: Secondary | ICD-10-CM | POA: Diagnosis not present

## 2020-02-26 DIAGNOSIS — E8881 Metabolic syndrome: Secondary | ICD-10-CM

## 2020-02-26 DIAGNOSIS — D649 Anemia, unspecified: Secondary | ICD-10-CM | POA: Diagnosis not present

## 2020-02-26 NOTE — Progress Notes (Signed)
   Subjective:    Patient ID: Sharon Russell, female    DOB: February 09, 1946, 74 y.o.   MRN: [id number]  HPI  74 year old Female for 6 month follow up. Doing well without complaint.  Has had both COVID-19 vaccines.  History of hypertension and diet-controlled diabetes.  Hemoglobin A1c is excellent at 5.8% but was even better in September at 5.5%.  Likely did not have as much exercise and could have consumed more calories during the winter.  History of anemia but in December hemoglobin had improved to 11.6 g.  Suggested Cologuard and this was done in January with negative result.  Lipids have not really improved since September.  Total cholesterol was 246 and was 249 in September.  LDL cholesterol was 129 and is now 119.  She is on simvastatin 10 mg daily.  Her HDL is high at 108.  We will continue to monitor this.  Did not change statin medication.  Review of Systems     Objective:   Physical Exam 130/70, pulse 68, pulse ox 98%, Weighed 200 pounds Neck supple.  Chest clear to auscultation.  Cardiac exam regular rate and rhythm normal S1 and S2 without murmurs.  No lower extremity edema.      Assessment & Plan:  Pure hypercholesterolemia-continue low-dose statin medication and encourage more exercise  Essential hypertension-stable on current regimen  Normocytic anemia-recent Cologuard test was negative  Controlled type 2 diabetes mellitus with stable hemoglobin A1c at 5.8%.  Plan: Encourage patient to exercise more and watch her diet.  We will not change medications at this point in time.  Follow-up in 6 months at which time she will be due for Medicare wellness visit and CPE.

## 2020-03-05 ENCOUNTER — Other Ambulatory Visit: Payer: Self-pay | Admitting: Internal Medicine

## 2020-03-12 NOTE — Patient Instructions (Signed)
It was a pleasure to see you today.  Continue current medications.  Please work on diet and exercise.  Return in September for Medicare wellness health maintenance exam evaluation of medical issues and fasting labs.

## 2020-04-02 ENCOUNTER — Other Ambulatory Visit: Payer: Self-pay | Admitting: Internal Medicine

## 2020-04-02 DIAGNOSIS — R7302 Impaired glucose tolerance (oral): Secondary | ICD-10-CM

## 2020-04-24 ENCOUNTER — Other Ambulatory Visit: Payer: Self-pay | Admitting: Internal Medicine

## 2020-04-26 ENCOUNTER — Other Ambulatory Visit: Payer: Self-pay | Admitting: Internal Medicine

## 2020-04-26 DIAGNOSIS — E118 Type 2 diabetes mellitus with unspecified complications: Secondary | ICD-10-CM

## 2020-07-09 ENCOUNTER — Other Ambulatory Visit: Payer: Self-pay | Admitting: Internal Medicine

## 2020-07-25 DIAGNOSIS — H2513 Age-related nuclear cataract, bilateral: Secondary | ICD-10-CM | POA: Diagnosis not present

## 2020-07-25 DIAGNOSIS — H402231 Chronic angle-closure glaucoma, bilateral, mild stage: Secondary | ICD-10-CM | POA: Diagnosis not present

## 2020-08-25 ENCOUNTER — Telehealth: Payer: Self-pay | Admitting: Internal Medicine

## 2020-08-25 ENCOUNTER — Other Ambulatory Visit: Payer: Self-pay

## 2020-08-25 ENCOUNTER — Ambulatory Visit (INDEPENDENT_AMBULATORY_CARE_PROVIDER_SITE_OTHER): Payer: Medicare HMO | Admitting: Internal Medicine

## 2020-08-25 VITALS — Temp 99.0°F

## 2020-08-25 DIAGNOSIS — E7800 Pure hypercholesterolemia, unspecified: Secondary | ICD-10-CM | POA: Diagnosis not present

## 2020-08-25 DIAGNOSIS — R05 Cough: Secondary | ICD-10-CM

## 2020-08-25 DIAGNOSIS — H401111 Primary open-angle glaucoma, right eye, mild stage: Secondary | ICD-10-CM

## 2020-08-25 DIAGNOSIS — E119 Type 2 diabetes mellitus without complications: Secondary | ICD-10-CM | POA: Diagnosis not present

## 2020-08-25 DIAGNOSIS — I10 Essential (primary) hypertension: Secondary | ICD-10-CM | POA: Diagnosis not present

## 2020-08-25 DIAGNOSIS — R059 Cough, unspecified: Secondary | ICD-10-CM

## 2020-08-25 DIAGNOSIS — Z20822 Contact with and (suspected) exposure to covid-19: Secondary | ICD-10-CM | POA: Diagnosis not present

## 2020-08-25 DIAGNOSIS — U071 COVID-19: Secondary | ICD-10-CM | POA: Diagnosis not present

## 2020-08-25 MED ORDER — PREDNISONE 10 MG PO TABS: ORAL_TABLET | ORAL | 0 refills | Status: DC

## 2020-08-25 MED ORDER — AZITHROMYCIN 250 MG PO TABS: ORAL_TABLET | ORAL | 0 refills | Status: DC

## 2020-08-25 NOTE — Telephone Encounter (Signed)
Sharon Russell [phone]  [name] called to she is coughing, sneezing and having a lot of mucus, she was around her sister on Friday and she tested positive for COVID  Sunday, however her symptoms started on Thursday. I have canceled  her labs and CPE for tomorrow. she has been exposed to COVID. She wants to know if she she get tested for  COVID or what should she do?

## 2020-08-25 NOTE — Telephone Encounter (Signed)
scheduled

## 2020-08-25 NOTE — Telephone Encounter (Signed)
Car visit today 

## 2020-08-26 ENCOUNTER — Other Ambulatory Visit: Payer: Medicare HMO | Admitting: Internal Medicine

## 2020-08-26 ENCOUNTER — Encounter: Payer: Self-pay | Admitting: Internal Medicine

## 2020-08-26 ENCOUNTER — Telehealth (INDEPENDENT_AMBULATORY_CARE_PROVIDER_SITE_OTHER): Payer: Medicare HMO | Admitting: Internal Medicine

## 2020-08-26 DIAGNOSIS — U071 COVID-19: Secondary | ICD-10-CM | POA: Diagnosis not present

## 2020-08-26 NOTE — Telephone Encounter (Signed)
I called  patient tonight via telephone and spoke with her regarding positive COVID-19 PCR test result.  Patient was seen in person yesterday regarding respiratory infection symptoms and exposure to her sister who was recently diagnosed with COVID-19.  This evening, she is identified using 2 identifiers as Sharon Russell, a patient in this practice.  She is agreeable to speak with me this evening.  Patient was surprised to hear that she has COVID-19.  She reports she is feeling better and has no shortness of breath nausea, vomiting or significant myalgias.  Patient has a history of dependent edema, hypertension, hyperlipidemia, and impaired glucose tolerance.  During her visit yesterday, she was found to have a congested cough, malaise and fatigue.  She was treated with Zithromax Z-PAK and a tapering course of prednisone starting with 6 tablets day 1 and decreasing by 10 mg daily.  She reports that the medication has helped her a great deal and she is feeling better.  She says that her pulse oximetry on room air is 97% which is excellent.  She still sounds nasally congested.  Patient was advised to stay well-hydrated and to rest.  She needs to quarantine for 10 days from September 13 and complete her course of medications prescribed.  Note that patient had COVID-19 immunizations February 6 and February 27 of this year.  She qualifies for infusion therapy but is declining infusion therapy at this time.  She feels like she is improving and would like to see how she gets along.  Time spent with patient on phone and reviewing chart is 7 minutes.

## 2020-08-27 LAB — RESPIRATORY VIRUS PANEL

## 2020-08-27 LAB — SARS-COV-2 RNA,(COVID-19) QUALITATIVE NAAT: SARS CoV2 RNA: DETECTED — CR

## 2020-08-29 ENCOUNTER — Encounter: Payer: Medicare HMO | Admitting: Internal Medicine

## 2020-08-29 ENCOUNTER — Telehealth: Payer: Self-pay | Admitting: Internal Medicine

## 2020-08-29 NOTE — Telephone Encounter (Signed)
Patient returned Dr Beryle Quant call to say she is feeling better. She still has some cough no where near as bad as it was, still very fatigued, very little appetite. She will continue to work on getting her strength and appetite back and call if she needs [place].

## 2020-08-29 NOTE — Telephone Encounter (Signed)
Faxed Positive COVID-19 to [facility] [phone], phone number [phone] on 9.15.2021.

## 2020-08-30 ENCOUNTER — Encounter: Payer: Self-pay | Admitting: Internal Medicine

## 2020-08-30 NOTE — Patient Instructions (Signed)
Take prednisone in tapering course as directed in Zithromax Z-PAK as directed.  Rest at home and drink plenty of fluids.  Monitor pulse oximetry and temperature.  Please call me immediately if symptoms worsen.

## 2020-08-30 NOTE — Progress Notes (Signed)
   Subjective:    Patient ID: Sharon Russell, female    DOB: Aug 15, 1946, 74 y.o.   MRN: [id number]  HPI  74 year old Female seen today with complaint of cough and congestion.  Has malaise and fatigue.  Sister has been diagnosed with COVID-19 and patient has been around sister recently.  Has a lot of postnasal drip, coughing and sneezing.  Symptoms had onset around September 9.  Patient was scheduled for health maintenance exam but we have canceled that due to this COVID-19 exposure.  She has a history of hypertension, diet-controlled diabetes mellitus, glaucoma, hyperlipidemia.  She has had 2 COVID-19 immunizations in February of this year.  Review of Systems has malaise and fatigue and cough with decreased energy.  No shortness of breath.     Objective:   Physical Exam Pulse oximetry is normal.  She is afebrile.  She is alert and oriented.  Is coughing.  Her chest is clear to auscultation without rales or wheezing.  She sounds hoarse when she speaks.       Assessment & Plan:  COVID-19 virus infection-have prescribed Zithromax and tapering course of prednisone  Essential hypertension-stable  Hyperlipidemia-treated with statin  Type 2 diabetes mellitus-stable  History of glaucoma  Plan: She qualifies for the infusion given her age and medical issues but she says she does not feel all that ill and would like to stay at home.  She has albuterol inhaler to use if needed.  Was treated with Zithromax Z-PAK 2 p.o. day 1 followed by 1 p.o. days 2 through 5.  She was given a tapering course of prednisone starting with 60 mg day 1 and decreasing by 10 mg daily i.e. 6-5-4-3-2-1 taper.  She is to monitor her pulse oximetry and call me if symptoms worsen.  Addendum: COVID-19 test is positive and patient was informed of results.  Says she is feeling reasonably well without significant hypoxia.  She prefers to not have the infusion therapy and will call if symptoms worsen.

## 2020-09-01 IMAGING — US US RENAL
1 series · 14 of 25 positions shown · non-contrast
Comparison: 03/21/2008 ultrasound.

CLINICAL DATA: 72-year-old female with elevated creatinine. Initial
encounter.

EXAM:
RENAL / URINARY TRACT ULTRASOUND COMPLETE

[Series 1: us renal · 0.18mm/px · 14 of 42 slices shown]
[im 1/42]
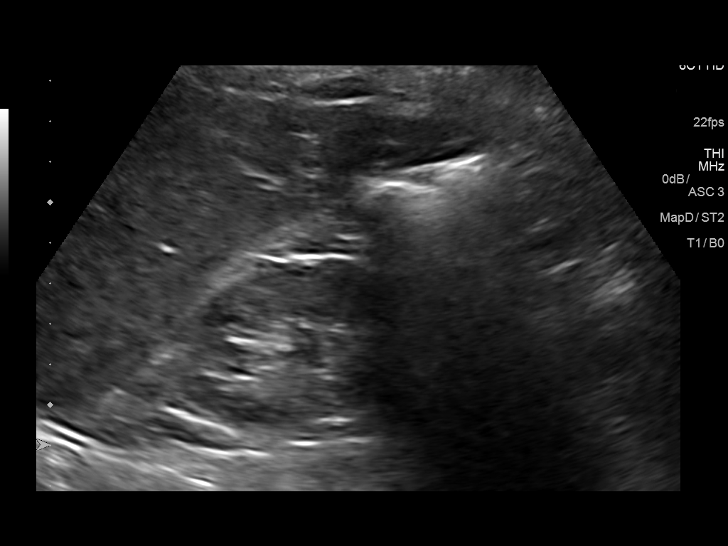
[im 4/42]
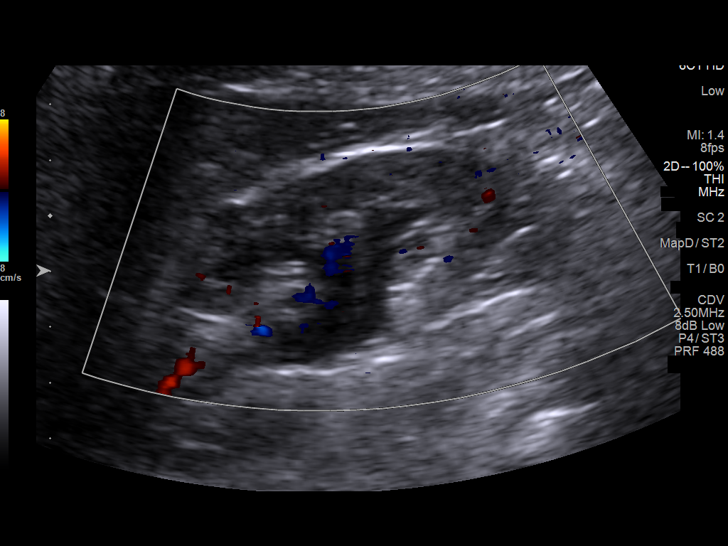
[im 7/42]
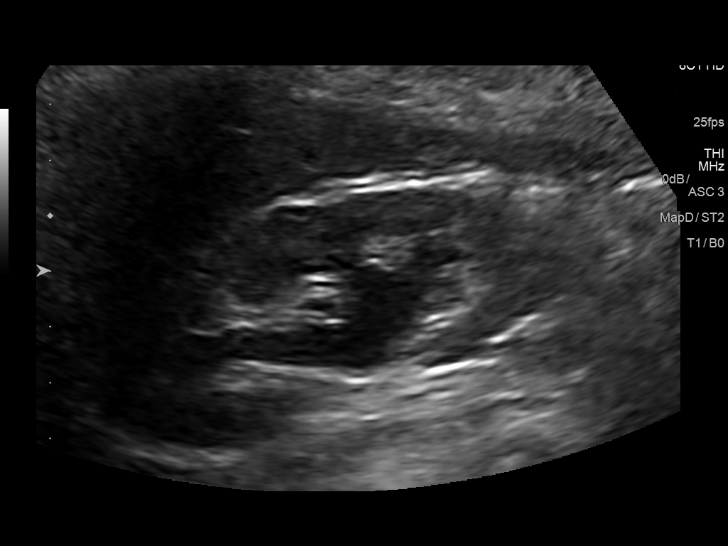
[im 11/42]
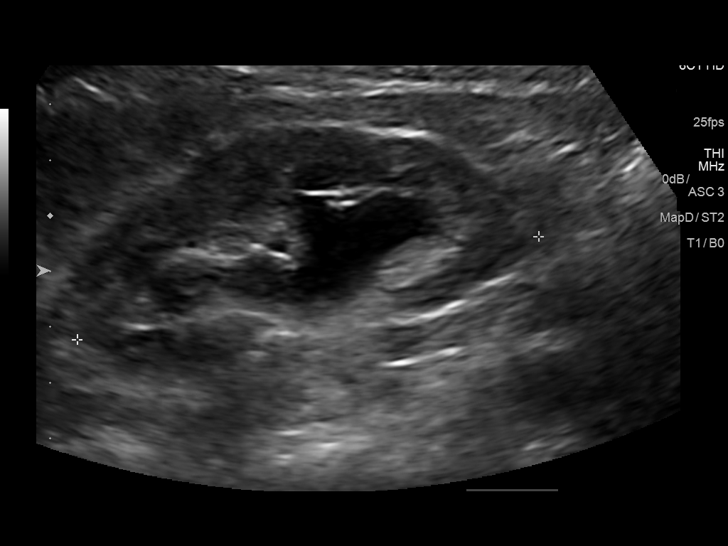
[im 14/42]
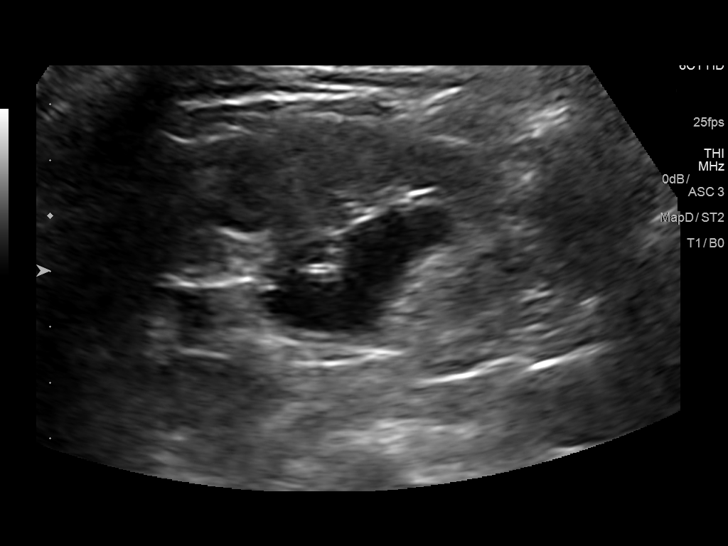
[im 16/42]
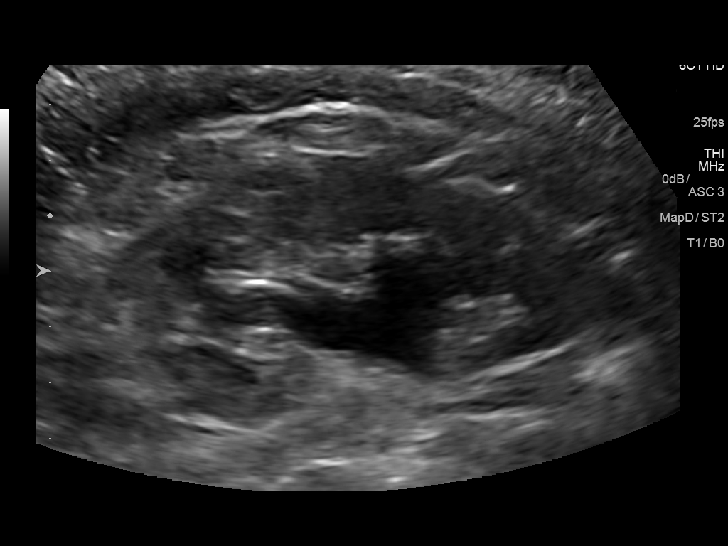
[im 19/42]
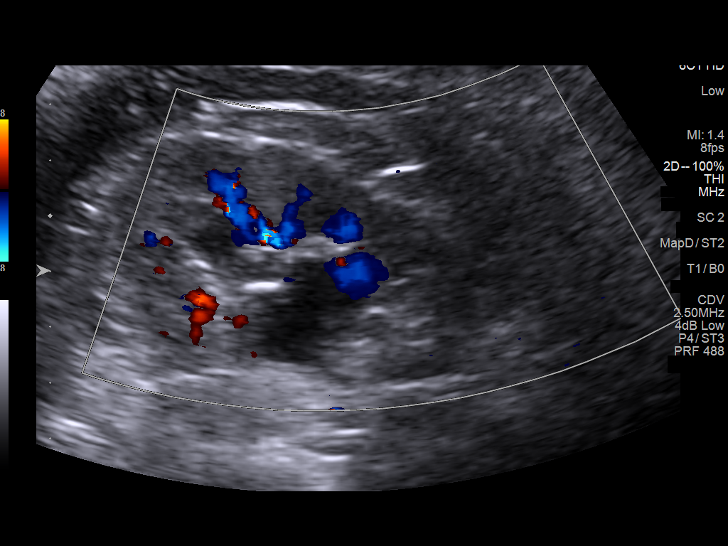
[im 23/42]
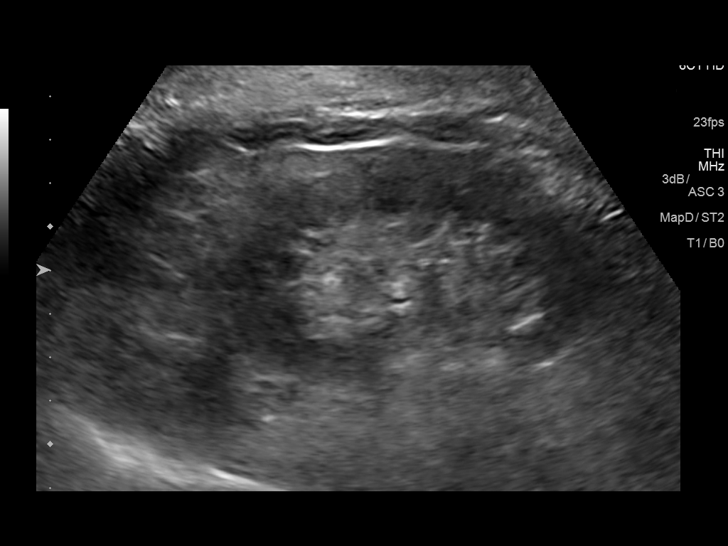
[im 26/42]
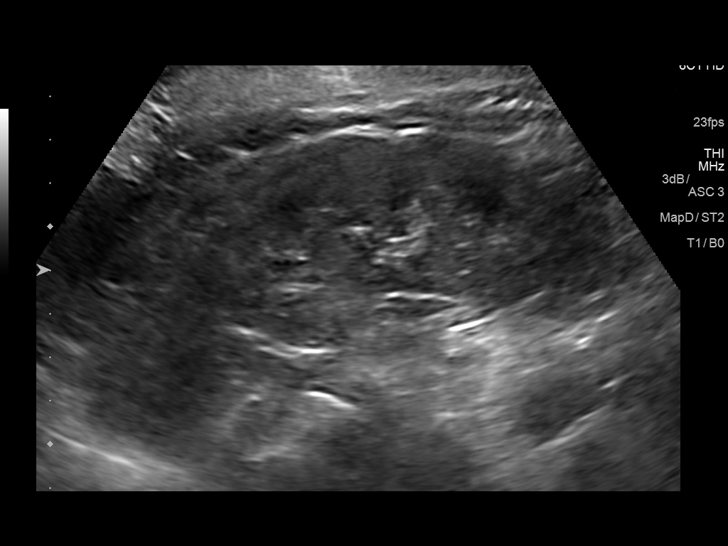
[im 28/42]
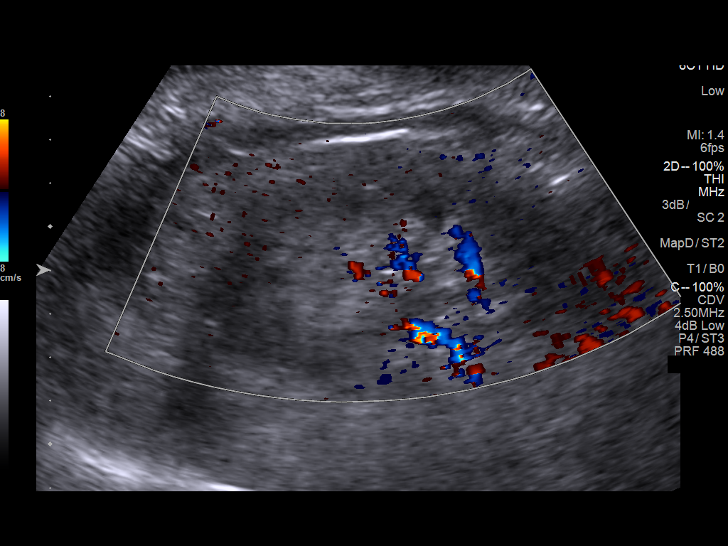
[im 31/42]
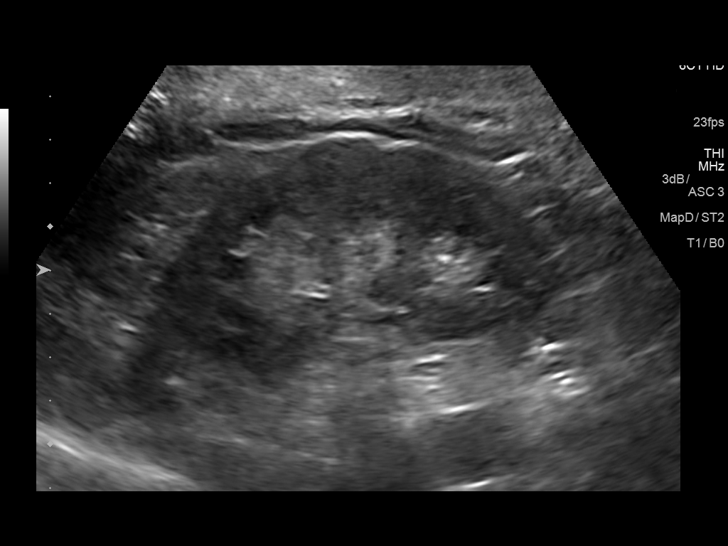
[im 35/42]
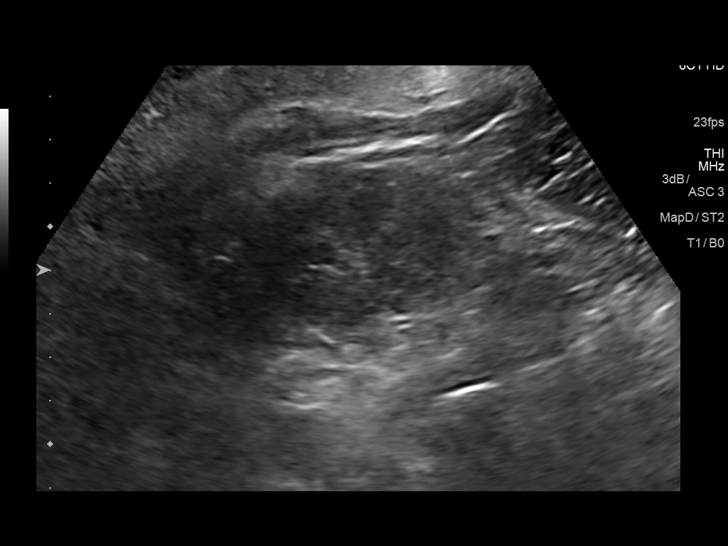
[im 38/42]
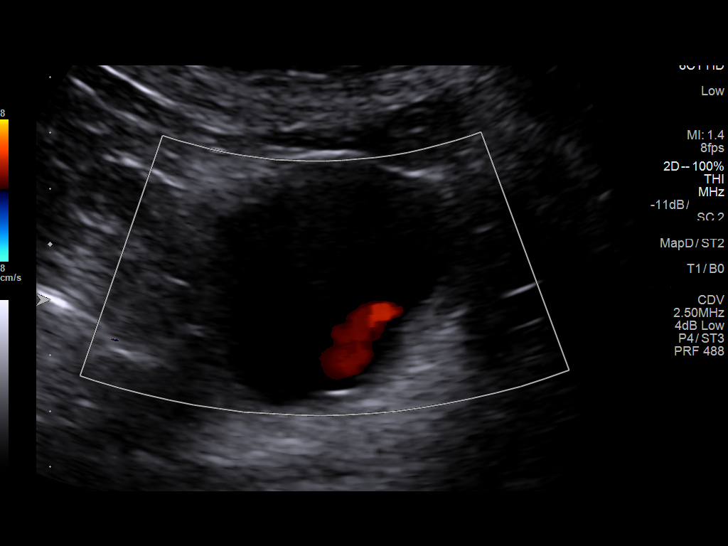
[im 42/42]
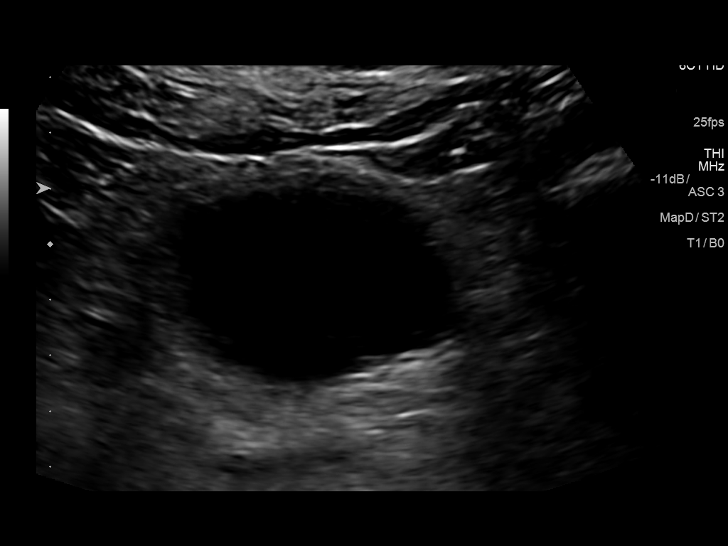

[14 of 25 positions shown; findings below may reference images not displayed]

FINDINGS: Right Kidney:

Length: 9.4 cm. Echogenicity within normal limits. Mild to slightly
moderate right-sided hydronephrosis. No worrisome renal lesion
noted.

Left Kidney:

Length: 9.7 cm. Echogenicity within normal limits. No mass or
hydronephrosis visualized.

Bladder:

Appears normal for degree of bladder distention.
IMPRESSION: 1. Mild to slightly moderate right-sided hydronephrosis appears
long-standing, relatively similar to 7553 exam. Etiology
indeterminate (possibly right ureteropelvic junction obstruction
configuration).
2. Otherwise negative exam.

## 2020-09-17 ENCOUNTER — Other Ambulatory Visit: Payer: Self-pay | Admitting: Internal Medicine

## 2020-10-20 ENCOUNTER — Other Ambulatory Visit: Payer: Medicare HMO | Admitting: Internal Medicine

## 2020-10-20 ENCOUNTER — Other Ambulatory Visit: Payer: Self-pay

## 2020-10-20 DIAGNOSIS — R7302 Impaired glucose tolerance (oral): Secondary | ICD-10-CM | POA: Diagnosis not present

## 2020-10-20 DIAGNOSIS — E7800 Pure hypercholesterolemia, unspecified: Secondary | ICD-10-CM | POA: Diagnosis not present

## 2020-10-20 DIAGNOSIS — N183 Chronic kidney disease, stage 3 unspecified: Secondary | ICD-10-CM

## 2020-10-20 DIAGNOSIS — M1712 Unilateral primary osteoarthritis, left knee: Secondary | ICD-10-CM

## 2020-10-20 DIAGNOSIS — E119 Type 2 diabetes mellitus without complications: Secondary | ICD-10-CM | POA: Diagnosis not present

## 2020-10-20 DIAGNOSIS — Z0000 Encounter for general adult medical examination without abnormal findings: Secondary | ICD-10-CM | POA: Diagnosis not present

## 2020-10-20 DIAGNOSIS — I10 Essential (primary) hypertension: Secondary | ICD-10-CM | POA: Diagnosis not present

## 2020-10-20 DIAGNOSIS — D509 Iron deficiency anemia, unspecified: Secondary | ICD-10-CM

## 2020-10-21 ENCOUNTER — Encounter: Payer: Self-pay | Admitting: Internal Medicine

## 2020-10-21 ENCOUNTER — Other Ambulatory Visit: Payer: Self-pay | Admitting: Internal Medicine

## 2020-10-21 ENCOUNTER — Ambulatory Visit (INDEPENDENT_AMBULATORY_CARE_PROVIDER_SITE_OTHER): Payer: Medicare HMO | Admitting: Internal Medicine

## 2020-10-21 VITALS — BP 120/80 | HR 76 | Ht 60.75 in | Wt 201.0 lb

## 2020-10-21 DIAGNOSIS — Z23 Encounter for immunization: Secondary | ICD-10-CM | POA: Diagnosis not present

## 2020-10-21 DIAGNOSIS — E8881 Metabolic syndrome: Secondary | ICD-10-CM

## 2020-10-21 DIAGNOSIS — M1712 Unilateral primary osteoarthritis, left knee: Secondary | ICD-10-CM

## 2020-10-21 DIAGNOSIS — E119 Type 2 diabetes mellitus without complications: Secondary | ICD-10-CM

## 2020-10-21 DIAGNOSIS — N1831 Chronic kidney disease, stage 3a: Secondary | ICD-10-CM | POA: Diagnosis not present

## 2020-10-21 DIAGNOSIS — E1169 Type 2 diabetes mellitus with other specified complication: Secondary | ICD-10-CM | POA: Diagnosis not present

## 2020-10-21 DIAGNOSIS — Z0000 Encounter for general adult medical examination without abnormal findings: Secondary | ICD-10-CM | POA: Diagnosis not present

## 2020-10-21 DIAGNOSIS — Z8616 Personal history of COVID-19: Secondary | ICD-10-CM | POA: Diagnosis not present

## 2020-10-21 DIAGNOSIS — I10 Essential (primary) hypertension: Secondary | ICD-10-CM

## 2020-10-21 DIAGNOSIS — E785 Hyperlipidemia, unspecified: Secondary | ICD-10-CM

## 2020-10-21 DIAGNOSIS — H401111 Primary open-angle glaucoma, right eye, mild stage: Secondary | ICD-10-CM

## 2020-10-21 DIAGNOSIS — E1122 Type 2 diabetes mellitus with diabetic chronic kidney disease: Secondary | ICD-10-CM | POA: Diagnosis not present

## 2020-10-21 LAB — POCT URINALYSIS DIPSTICK
Appearance: NEGATIVE
Bilirubin, UA: NEGATIVE
Blood, UA: NEGATIVE
Glucose, UA: NEGATIVE
Ketones, UA: NEGATIVE
Leukocytes, UA: NEGATIVE
Nitrite, UA: NEGATIVE
Odor: NEGATIVE
Protein, UA: NEGATIVE
Spec Grav, UA: 1.015 (ref 1.010–1.025)
Urobilinogen, UA: 0.2 E.U./dL
pH, UA: 6.5 (ref 5.0–8.0)

## 2020-10-21 LAB — COMPLETE METABOLIC PANEL WITH GFR
AG Ratio: 1.8 (calc) (ref 1.0–2.5)
ALT: 18 U/L (ref 6–29)
AST: 18 U/L (ref 10–35)
Albumin: 4.5 g/dL (ref 3.6–5.1)
Alkaline phosphatase (APISO): 82 U/L (ref 37–153)
BUN/Creatinine Ratio: 21 (calc) (ref 6–22)
BUN: 22 mg/dL (ref 7–25)
CO2: 25 mmol/L (ref 20–32)
Calcium: 10.2 mg/dL (ref 8.6–10.4)
Chloride: 103 mmol/L (ref 98–110)
Creat: 1.05 mg/dL — ABNORMAL HIGH (ref 0.60–0.93)
GFR, Est African American: 60 mL/min/{1.73_m2} (ref >=60)
GFR, Est Non African American: 52 mL/min/{1.73_m2} — ABNORMAL LOW (ref >=60)
Globulin: 2.5 g/dL (calc) (ref 1.9–3.7)
Glucose, Bld: 117 mg/dL — ABNORMAL HIGH (ref 65–99)
Potassium: 4.5 mmol/L (ref 3.5–5.3)
Sodium: 139 mmol/L (ref 135–146)
Total Bilirubin: 0.5 mg/dL (ref 0.2–1.2)
Total Protein: 7 g/dL (ref 6.1–8.1)

## 2020-10-21 LAB — CBC WITH DIFFERENTIAL/PLATELET
Absolute Monocytes: 582 cells/uL (ref 200–950)
Basophils Absolute: 39 cells/uL (ref 0–200)
Basophils Relative: 0.7 %
Eosinophils Absolute: 594 cells/uL — ABNORMAL HIGH (ref 15–500)
Eosinophils Relative: 10.6 %
HCT: 35.2 % (ref 35.0–45.0)
Hemoglobin: 11.6 g/dL — ABNORMAL LOW (ref 11.7–15.5)
Lymphs Abs: 2044 cells/uL (ref 850–3900)
MCH: 28.3 pg (ref 27.0–33.0)
MCHC: 33 g/dL (ref 32.0–36.0)
MCV: 85.9 fL (ref 80.0–100.0)
MPV: 11 fL (ref 7.5–12.5)
Monocytes Relative: 10.4 %
Neutro Abs: 2341 cells/uL (ref 1500–7800)
Neutrophils Relative %: 41.8 %
Platelets: 217 10*3/uL (ref 140–400)
RBC: 4.1 10*6/uL (ref 3.80–5.10)
RDW: 12.5 % (ref 11.0–15.0)
Total Lymphocyte: 36.5 %
WBC: 5.6 10*3/uL (ref 3.8–10.8)

## 2020-10-21 LAB — HEMOGLOBIN A1C
Hgb A1c MFr Bld: 6 % of total Hgb — ABNORMAL HIGH (ref <5.7)
Mean Plasma Glucose: 126 (calc)
eAG (mmol/L): 7 (calc)

## 2020-10-21 LAB — LIPID PANEL
Cholesterol: 259 mg/dL — ABNORMAL HIGH (ref <200)
HDL: 98 mg/dL (ref >=50)
LDL Cholesterol (Calc): 132 mg/dL (calc) — ABNORMAL HIGH
Non-HDL Cholesterol (Calc): 161 mg/dL (calc) — ABNORMAL HIGH (ref <130)
Total CHOL/HDL Ratio: 2.6 (calc) (ref <5.0)
Triglycerides: 156 mg/dL — ABNORMAL HIGH (ref <150)

## 2020-10-21 LAB — TSH: TSH: 1.35 mIU/L (ref 0.40–4.50)

## 2020-10-21 MED ORDER — ALBUTEROL SULFATE HFA 108 (90 BASE) MCG/ACT IN AERS: 2.0000 | INHALATION_SPRAY | Freq: Four times a day (QID) | RESPIRATORY_TRACT | 99 refills | Status: DC | PRN

## 2020-10-21 NOTE — Patient Instructions (Addendum)
Flu vaccine given. Continue same meds and follow up in 6 months. It was a pleasure to see you today.

## 2020-10-21 NOTE — Progress Notes (Signed)
Subjective:    Patient ID: Sharon Russell, female    DOB: April 09, 1946, 75 y.o.   MRN: [id number]  HPI 74 year old Female for Medicare wellness, health maintenance exam and evaluation of medical issues.  She has been followed here since 04-25-11.  Has been hypertensive for some 7 years prior to that.  Increased serum creatinine likely due to chronic kidney disease.  Seen by [facility] in 04/24/18 and recommended Mobic be discontinued.  Had slight right-sided hydronephrosis on ultrasound in 04-24-18.  Was diagnosed with stage IIIa chronic kidney disease.  Has not been back to nephrologist.  Had COVID-19 in September of this year.  Recovered at home and did well.  Cologard was done in January and was suggested repeat in 3 years. Mammogram due in December. Flu vaccine given today.  Refilled Ventolin inhaler at her request.  History of abdominal hysterectomy without oophorectomy in 1990 for fibroids.  Cholecystectomy 24-Apr-2008.  Had colonoscopy at [facility] in 04-24-2009 which was normal.  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 Loreta Ave at [facility].  She uses Xalatan drops in both eyes.  History of E. coli UTI in Apr 24, 2018.  History of diet-controlled diabetes with hemoglobin A1c stable at 6%.  Bone density study done at [facility] with lowest T score of -1.5.  History of osteoarthritis of her knees and has seen orthopedist for knee injections.  Last seen at [facility] February 2021.  Was told she could have injections every 6 months  Social history: She is a widow.  Husband passed away in early 04/24/2016 of a sudden MI.  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  Family history: Father died of an apparent cardiac arrest with history of pacemaker and was 31 years old.  Mother died at age 28 in her sleep.  3 brothers.  One of them is living.  1 died  of complications of alcoholism with history of seizure disorder.  Another brother died at age 47 of an MI.  Patient had 3 sisters, one of them died with a stroke.  2 sisters are living.  Mother had history of diabetes and hypertension.  1 brother and 1 sister with hypertension.  Review of Systems  Constitutional: Negative.   Respiratory: Negative.   Cardiovascular: Negative.   Gastrointestinal: Negative.   Genitourinary: Negative.   Musculoskeletal:       Knee pain  Neurological: Negative.   Psychiatric/Behavioral: Negative.    no new complaints     Objective:   Physical Exam Vitals reviewed.  Constitutional:      Appearance: Normal appearance.  HENT:     Head: Normocephalic and atraumatic.     Right Ear: Tympanic membrane normal.     Left Ear: Tympanic membrane normal.     Nose: Nose normal.  Eyes:     General: No scleral icterus.       Right eye: No discharge.        Left eye: No discharge.     Extraocular Movements: Extraocular movements intact.     Pupils: Pupils are equal, round, and reactive to light.  Cardiovascular:     Rate and Rhythm: Normal rate and regular rhythm.     Heart sounds: Normal heart sounds. No murmur heard.   Pulmonary:     Effort: Pulmonary effort is normal.  Breath sounds: Normal breath sounds. No wheezing or rales.  Abdominal:     Palpations: Abdomen is soft. There is no mass.     Tenderness: There is no abdominal tenderness. There is no guarding or rebound.  Musculoskeletal:     Right lower leg: No edema.     Left lower leg: No edema.  Skin:    General: Skin is warm and dry.  Neurological:     General: No focal deficit present.     Mental Status: She is alert.  Psychiatric:        Mood and Affect: Mood normal.        Behavior: Behavior normal.        Thought Content: Thought content normal.        Judgment: Judgment normal.           Assessment & Plan:  Essential hypertension-stable on current regimen  Elevated serum  creatinine-has been evaluated at [facility] with history of right-sided hydronephrosis.  Mobic discontinued in 2019.  Now gets knee injections for knee pain.  Creatinine improved after Mobic was discontinued.  Creatinine stable at 1.05.  Has been as high as 1.58 in 2019.  Bilateral knee arthritis treated with cortisone injections intermittently  History of glaucoma-followed by ophthalmologist and treated with Xalatan ophthalmic drops  Hyperlipidemia treated with Zocor 10 mg daily  History of impaired glucose tolerance-treated with diet  Essential hypertension treated with metoprolol, lisinopril/HCTZ.  Plan: Continue current medications and follow-up in 6 months.  Subjective:   Patient presents for Medicare Annual/Subsequent preventive examination.  Review Past Medical/Family/Social: See above   Risk Factors  Current exercise habits: Light exercise Dietary issues discussed: Low-fat low carbohydrate  Cardiac risk factors: Impaired glucose tolerance and hyperlipidemia, family history  Depression Screen  (Note: if answer to either of the following is "Yes", a more complete depression screening is indicated)   Over the past two weeks, have you felt down, depressed or hopeless? No  Over the past two weeks, have you felt little interest or pleasure in doing things? No Have you lost interest or pleasure in daily life? No Do you often feel hopeless? No Do you cry easily over simple problems? No   Activities of Daily Living  In your present state of health, do you have any difficulty performing the following activities?:   Driving? No  Managing money? No  Feeding yourself? No  Getting from bed to chair? No  Climbing a flight of stairs? No  Preparing food and eating?: No  Bathing or showering? No  Getting dressed: No  Getting to the toilet? No  Using the toilet:No  Moving around from place to place: No  In the past year have you fallen or had a near fall?:No   Are you sexually active? No  Do you have more than one partner? No   Hearing Difficulties: No  Do you often ask people to speak up or repeat themselves? No  Do you experience ringing or noises in your ears? No  Do you have difficulty understanding soft or whispered voices? No  Do you feel that you have a problem with memory? No Do you often misplace items? No    Home Safety:  Do you have a smoke alarm at your residence? Yes Do you have grab bars in the bathroom?  Yes Do you have throw rugs in your house?  Yes   Cognitive Testing  Alert? Yes Normal Appearance?Yes  Oriented to person? Yes Place? Yes  Time? Yes  Recall of three objects? Yes  Can perform simple calculations? Yes  Displays appropriate judgment?Yes  Can read the correct time from a watch face?Yes   List the Names of Other Physician/Practitioners you currently use:  See referral list for the physicians patient is currently seeing.  [facility]  Ophthalmologist   Review of Systems: See above   Objective:     General appearance: Appears stated age  Head: Normocephalic, without obvious abnormality, atraumatic  Eyes: conj clear, EOMi PEERLA  Ears: normal TM's and external ear canals both ears  Nose: Nares normal. Septum midline. Mucosa normal. No drainage or sinus tenderness.  Throat: lips, mucosa, and tongue normal; teeth and gums normal  Neck: no adenopathy, no carotid bruit, no JVD, supple, symmetrical, trachea midline and thyroid not enlarged, symmetric, no tenderness/mass/nodules  No CVA tenderness.  Lungs: clear to auscultation bilaterally  Breasts: normal appearance, no masses or tenderness Heart: regular rate and rhythm, S1, S2 normal, no murmur, click, rub or gallop  Abdomen: soft, non-tender; bowel sounds normal; no masses, no organomegaly  Musculoskeletal: ROM normal in all joints, no crepitus, no deformity, Normal muscle strengthen. Back  is symmetric, no curvature. Skin: Skin  color, texture, turgor normal. No rashes or lesions  Lymph nodes: Cervical, supraclavicular, and axillary nodes normal.  Neurologic: CN 2 -12 Normal, Normal symmetric reflexes. Normal coordination and gait  Psych: Alert & Oriented x 3, Mood appear stable.    Assessment:    Annual wellness medicare exam   Plan:    During the course of the visit the patient was educated and counseled about appropriate screening and preventive services including:   Annual mammogram  Annual flu vaccine given 10/21/2020  2 COVID-19 immunizations documented and needs booster  Has had both Prevnar 13 13 and pneumococcal 23 vaccines  Tetanus immunization is currently up-to-date     Patient Instructions (the written plan) was given to the patient.  Medicare Attestation  I have personally reviewed:  The patient's medical and social history  Their use of alcohol, tobacco or illicit drugs  Their current medications and supplements  The patient's functional ability including ADLs,fall risks, home safety risks, cognitive, and hearing and visual impairment  Diet and physical activities  Evidence for depression or mood disorders  The patient's weight, height, BMI, and visual acuity have been recorded in the chart. I have made referrals, counseling, and provided education to the patient based on review of the above and I have provided the patient with a written personalized care plan for preventive services.

## 2020-11-28 ENCOUNTER — Other Ambulatory Visit: Payer: Self-pay | Admitting: Internal Medicine

## 2020-12-01 DIAGNOSIS — Z803 Family history of malignant neoplasm of breast: Secondary | ICD-10-CM | POA: Diagnosis not present

## 2020-12-01 DIAGNOSIS — Z1231 Encounter for screening mammogram for malignant neoplasm of breast: Secondary | ICD-10-CM | POA: Diagnosis not present

## 2020-12-01 LAB — HM MAMMOGRAPHY

## 2020-12-02 ENCOUNTER — Encounter: Payer: Self-pay | Admitting: Internal Medicine

## 2020-12-03 DIAGNOSIS — N1330 Unspecified hydronephrosis: Secondary | ICD-10-CM | POA: Diagnosis not present

## 2020-12-03 DIAGNOSIS — N183 Chronic kidney disease, stage 3 unspecified: Secondary | ICD-10-CM | POA: Diagnosis not present

## 2020-12-03 DIAGNOSIS — R7302 Impaired glucose tolerance (oral): Secondary | ICD-10-CM | POA: Diagnosis not present

## 2020-12-03 DIAGNOSIS — I129 Hypertensive chronic kidney disease with stage 1 through stage 4 chronic kidney disease, or unspecified chronic kidney disease: Secondary | ICD-10-CM | POA: Diagnosis not present

## 2020-12-11 ENCOUNTER — Encounter: Payer: Self-pay | Admitting: Internal Medicine

## 2021-02-05 DIAGNOSIS — H402231 Chronic angle-closure glaucoma, bilateral, mild stage: Secondary | ICD-10-CM | POA: Diagnosis not present

## 2021-02-23 ENCOUNTER — Other Ambulatory Visit: Payer: Self-pay | Admitting: Internal Medicine

## 2021-02-23 NOTE — Telephone Encounter (Signed)
Refill x 6 months 

## 2021-02-26 ENCOUNTER — Telehealth: Payer: Self-pay | Admitting: Internal Medicine

## 2021-02-26 DIAGNOSIS — E118 Type 2 diabetes mellitus with unspecified complications: Secondary | ICD-10-CM

## 2021-02-26 NOTE — Telephone Encounter (Signed)
Refill x one year °

## 2021-02-26 NOTE — Telephone Encounter (Signed)
Received Fax RX request from  Pharmacy -  [facility] - [place], [place] - [address] Phone:  [phone]  Fax:  [phone]       Medication - [employer] True Metrix Test Strip True Metrix Blood Glucose Mtr Trueplus Ultra Thin 30G Lancet BD Single Use Swab  Last Refill -  Last OV - 10/21/2020  Last CPE - 10/21/2020  Next Appointment - 04/21/21

## 2021-02-27 MED ORDER — BD SWAB SINGLE USE REGULAR PADS: MEDICATED_PAD | 99 refills | Status: DC

## 2021-02-27 MED ORDER — TRUEPLUS LANCETS 30G MISC: 99 refills | Status: DC

## 2021-02-27 MED ORDER — RELION TRUE METRIX TEST STRIPS VI STRP: ORAL_STRIP | 12 refills | Status: DC

## 2021-02-27 MED ORDER — TRUE METRIX AIR GLUCOSE METER DEVI: 3 refills | Status: DC

## 2021-02-27 NOTE — Addendum Note (Signed)
Addended by: Gregery Na on: 02/27/2021 12:40 PM   Modules accepted: Orders

## 2021-03-18 DIAGNOSIS — H40033 Anatomical narrow angle, bilateral: Secondary | ICD-10-CM | POA: Diagnosis not present

## 2021-03-18 DIAGNOSIS — H2513 Age-related nuclear cataract, bilateral: Secondary | ICD-10-CM | POA: Diagnosis not present

## 2021-03-18 DIAGNOSIS — H402231 Chronic angle-closure glaucoma, bilateral, mild stage: Secondary | ICD-10-CM | POA: Diagnosis not present

## 2021-04-21 ENCOUNTER — Other Ambulatory Visit: Payer: Medicare HMO | Admitting: Internal Medicine

## 2021-04-21 ENCOUNTER — Other Ambulatory Visit: Payer: Self-pay

## 2021-04-21 DIAGNOSIS — E118 Type 2 diabetes mellitus with unspecified complications: Secondary | ICD-10-CM

## 2021-04-21 DIAGNOSIS — I10 Essential (primary) hypertension: Secondary | ICD-10-CM

## 2021-04-21 DIAGNOSIS — E7800 Pure hypercholesterolemia, unspecified: Secondary | ICD-10-CM | POA: Diagnosis not present

## 2021-04-22 LAB — HEMOGLOBIN A1C
Hgb A1c MFr Bld: 5.8 % of total Hgb — ABNORMAL HIGH (ref <5.7)
Mean Plasma Glucose: 120 mg/dL
eAG (mmol/L): 6.6 mmol/L

## 2021-04-22 LAB — LIPID PANEL
Cholesterol: 254 mg/dL — ABNORMAL HIGH (ref <200)
HDL: 111 mg/dL (ref >=50)
LDL Cholesterol (Calc): 124 mg/dL (calc) — ABNORMAL HIGH
Non-HDL Cholesterol (Calc): 143 mg/dL (calc) — ABNORMAL HIGH (ref <130)
Total CHOL/HDL Ratio: 2.3 (calc) (ref <5.0)
Triglycerides: 90 mg/dL (ref <150)

## 2021-04-22 LAB — HEPATIC FUNCTION PANEL
AG Ratio: 1.7 (calc) (ref 1.0–2.5)
ALT: 13 U/L (ref 6–29)
AST: 16 U/L (ref 10–35)
Albumin: 4.4 g/dL (ref 3.6–5.1)
Alkaline phosphatase (APISO): 92 U/L (ref 37–153)
Bilirubin, Direct: 0.1 mg/dL (ref 0.0–0.2)
Globulin: 2.6 g/dL (calc) (ref 1.9–3.7)
Indirect Bilirubin: 0.6 mg/dL (calc) (ref 0.2–1.2)
Total Bilirubin: 0.7 mg/dL (ref 0.2–1.2)
Total Protein: 7 g/dL (ref 6.1–8.1)

## 2021-04-23 ENCOUNTER — Other Ambulatory Visit: Payer: Self-pay

## 2021-04-23 ENCOUNTER — Ambulatory Visit (INDEPENDENT_AMBULATORY_CARE_PROVIDER_SITE_OTHER): Payer: Medicare HMO | Admitting: Internal Medicine

## 2021-04-23 ENCOUNTER — Encounter: Payer: Self-pay | Admitting: Internal Medicine

## 2021-04-23 VITALS — BP 130/70 | HR 66 | Ht 60.75 in | Wt 204.0 lb

## 2021-04-23 DIAGNOSIS — E1169 Type 2 diabetes mellitus with other specified complication: Secondary | ICD-10-CM | POA: Diagnosis not present

## 2021-04-23 DIAGNOSIS — M1712 Unilateral primary osteoarthritis, left knee: Secondary | ICD-10-CM | POA: Diagnosis not present

## 2021-04-23 DIAGNOSIS — H401111 Primary open-angle glaucoma, right eye, mild stage: Secondary | ICD-10-CM | POA: Diagnosis not present

## 2021-04-23 DIAGNOSIS — I10 Essential (primary) hypertension: Secondary | ICD-10-CM

## 2021-04-23 DIAGNOSIS — Z8616 Personal history of COVID-19: Secondary | ICD-10-CM

## 2021-04-23 DIAGNOSIS — E8881 Metabolic syndrome: Secondary | ICD-10-CM

## 2021-04-23 DIAGNOSIS — E785 Hyperlipidemia, unspecified: Secondary | ICD-10-CM | POA: Diagnosis not present

## 2021-04-23 DIAGNOSIS — E119 Type 2 diabetes mellitus without complications: Secondary | ICD-10-CM | POA: Diagnosis not present

## 2021-04-23 MED ORDER — SIMVASTATIN 20 MG PO TABS: 20.0000 mg | ORAL_TABLET | Freq: Every day | ORAL | 3 refills | Status: DC

## 2021-04-23 MED ORDER — DICLOFENAC SODIUM 1 % EX GEL: 2.0000 g | Freq: Four times a day (QID) | CUTANEOUS | 0 refills | Status: DC

## 2021-04-23 NOTE — Progress Notes (Signed)
   Subjective:    Patient ID: Sharon Russell, female    DOB: Nov 17, 1946, 75 y.o.   MRN: [id number]  HPI In for 6 month recheck. Had Covid-19 in September with no sequelae.  Had Medicare wellness visit in November 2021.  She has been followed here since 2012.  Developed hypertension some 7 years before 2012.  Increased serum creatinine due to chronic kidney disease.  She saw [facility] in 2019 and recommended that Mobic be discontinued.  Had slight right sided hydronephrosis on ultrasound in 2019 and was diagnosed at that time with stage IIIa chronic kidney disease but has not been back to nephrologist.  History of glaucoma, osteoarthritis of the knees and had steroid injections in her knees in the past.  They told her she can have these injections every 6 months.  [facility] those [facility].  History of essential hypertension, impaired glucose tolerance treated with diet, hyperlipidemia treated with Zocor 10 mg daily, obesity.    Review of Systems new complaint is swelling of ankle  and feet as well as pain. Voltaren topical has helped.     Objective:   Physical Exam BMI is 38.86.  Blood pressure 130/70 pulse 66 pulse oximetry 97% weight 204 pounds  Skin: Warm and dry.  Neck is supple without JVD thyromegaly or carotid bruits.  Chest is clear to auscultation.  Cardiac exam regular rate and rhythm.  No lower extremity pitting edema.  Her hemoglobin A1c is stable at 5.8% and was 6% in November 2021.  Her total cholesterol is 254 and was 259 in November 2021 and prior to that 246 in March 2021.  HDL is good at 111.  Triglycerides normal at 90.  LDL cholesterol 124 and has ranged from 1 19-1 32 over the past 3 years.  We are going to increase Zocor from 10 to 20 mg daily and she will follow-up on return.     Assessment & Plan:  BMI 38-discussion regarding diet and exercise  Pure hypercholesterolemia-increase Zocor from 10 to 20 mg  daily  Osteoarthritis of knees-refill Voltaren gel.  May be a candidate for steroid injections in knees once again she will check with orthopedist  Essential hypertension-stable on metoprolol and Zestoretic  Hypokalemia treated with potassium chloride 10 mEq daily.  History of glaucoma treated with Xalatan  History of COVID-19 infection and has recovered well  Plan: She is to watch her diet and try to get a bit more exercise.  Increasing statin medication from 10 to 20 mg daily and follow-up in November.

## 2021-04-23 NOTE — Patient Instructions (Signed)
Try to get a bit more exercise.  Watch diet.  Try to lose a bit of weight.  Continue Zestoretic and metoprolol.  Increase Zocor from 10 to 20 mg daily.  Continue potassium supplement.  Continue Xalatan for glaucoma.  Follow-up in November at time of Medicare wellness visit and health maintenance exam.

## 2021-05-09 ENCOUNTER — Other Ambulatory Visit: Payer: Self-pay | Admitting: Internal Medicine

## 2021-05-09 DIAGNOSIS — E118 Type 2 diabetes mellitus with unspecified complications: Secondary | ICD-10-CM

## 2021-05-21 DIAGNOSIS — Z23 Encounter for immunization: Secondary | ICD-10-CM | POA: Diagnosis not present

## 2021-09-11 ENCOUNTER — Other Ambulatory Visit: Payer: Self-pay | Admitting: Internal Medicine

## 2021-09-21 DIAGNOSIS — H40033 Anatomical narrow angle, bilateral: Secondary | ICD-10-CM | POA: Diagnosis not present

## 2021-09-21 DIAGNOSIS — H402231 Chronic angle-closure glaucoma, bilateral, mild stage: Secondary | ICD-10-CM | POA: Diagnosis not present

## 2021-09-21 DIAGNOSIS — H2513 Age-related nuclear cataract, bilateral: Secondary | ICD-10-CM | POA: Diagnosis not present

## 2021-09-21 DIAGNOSIS — H401111 Primary open-angle glaucoma, right eye, mild stage: Secondary | ICD-10-CM | POA: Diagnosis not present

## 2021-09-30 ENCOUNTER — Ambulatory Visit: Payer: Medicare HMO | Admitting: Orthopaedic Surgery

## 2021-09-30 ENCOUNTER — Telehealth: Payer: Self-pay

## 2021-09-30 DIAGNOSIS — G8929 Other chronic pain: Secondary | ICD-10-CM

## 2021-09-30 DIAGNOSIS — M25562 Pain in left knee: Secondary | ICD-10-CM

## 2021-09-30 DIAGNOSIS — M1712 Unilateral primary osteoarthritis, left knee: Secondary | ICD-10-CM

## 2021-09-30 MED ORDER — METHYLPREDNISOLONE ACETATE 40 MG/ML IJ SUSP
40.0000 mg | INTRAMUSCULAR | Status: AC | PRN
  Administered 2021-09-30: 40 mg via INTRA_ARTICULAR

## 2021-09-30 MED ORDER — LIDOCAINE HCL 1 % IJ SOLN
3.0000 mL | INTRAMUSCULAR | Status: AC | PRN
  Administered 2021-09-30: 3 mL

## 2021-09-30 NOTE — Telephone Encounter (Signed)
Noted  

## 2021-09-30 NOTE — Telephone Encounter (Signed)
Left knee gel injections  

## 2021-09-30 NOTE — Progress Notes (Signed)
Office Visit Note   Patient: Sharon Russell           Date of Birth: Feb 15, 1946           MRN: [id number] Visit Date: 09/30/2021              Requested by: Margaree Mackintosh, MD [address] [place] [address],  [place] [address] PCP: Margaree Mackintosh, MD   Assessment & Plan: Visit Diagnoses:  1. Primary osteoarthritis of left knee   2. Chronic pain of left knee     Plan: Since hyaluronic acid has helped her so much in the past for her left knee to treat the pain from osteoarthritis and in the fact she has failed conservative treatment measures and other modalities including steroid injections, we recommended a repeat hyaluronic acid injection for her left knee.  It has been over a month since her last injections I think it is prudent to order this again since this is helped her the most and she is still not interested in knee replacement surgery.  Follow-Up Instructions: No follow-ups on file.   Orders:  Orders Placed This Encounter  Procedures   Large Joint Inj    No orders of the defined types were placed in this encounter.     Procedures: Large Joint Inj: L knee on 09/30/2021 9:52 AM Indications: diagnostic evaluation and pain Details: 22 G 1.5 in needle, superolateral approach  Arthrogram: No  Medications: 3 mL lidocaine 1 %; 40 mg methylPREDNISolone acetate 40 MG/ML Outcome: tolerated well, no immediate complications Procedure, treatment alternatives, risks and benefits explained, specific risks discussed. Consent was given by the patient. Immediately prior to procedure a time out was called to verify the correct patient, procedure, equipment, support staff and site/side marked as required. Patient was prepped and draped in the usual sterile fashion.      Clinical Data: No additional findings.   Subjective: Chief Complaint  Patient presents with   Left Knee - Pain  The patient is well-known to [place].  She has known osteoarthritis of her left knee.  Hyaluronic acid  has helped significantly in the past.  She last had an injection in her knee in early February.  It is helped quite a bit and she is interested in another hyaluronic acid injection.  She is not a diabetic.  She does ambulate using a cane.  She is 75 years old.  She is worked on activity modification and weight loss as well as quad strengthening exercises.  She has tried other forms of conservative treatment including steroid injections.  The gel/hyaluronic acid injections in her left knee to treat the pain from osteoarthritis is what is helped her the most.  She has had no other acute change in her medical status.  HPI  Review of Systems There is currently listed no headache, chest pain, shortness of breath, fever, chills, nausea, vomiting  Objective: Vital Signs: There were no vitals taken for this visit.  Physical Exam She is alert and orient x3 and in no acute distress Ortho Exam Examination of her left knee shows slight varus malalignment.  There is global tenderness throughout the arc of motion with patellofemoral crepitation.  The knee is ligamentously stable. Specialty Comments:  No specialty comments available.  Imaging: No results found.   PMFS History: Patient Active Problem List   Diagnosis Date Noted   Anatomical narrow angle, bilateral 10/07/2018   Impaired glucose tolerance 09/02/2018   Chronic open angle glaucoma of both eyes  09/02/2018   Osteopenia 09/02/2018   Elevated serum creatinine 09/02/2018   Unilateral primary osteoarthritis, left knee 08/28/2018   Unilateral primary osteoarthritis, left hip 07/31/2018   Dependent edema 02/10/2016   Metabolic syndrome 02/10/2016   NS (nuclear sclerosis) 02/05/2015   Hyperlipidemia 09/09/2011   Hypertension 09/09/2011   Past Medical History:  Diagnosis Date   Arthritis    shoulder   Hypertension     Family History  Problem Relation Age of Onset   Diabetes Sister    Hypertension Sister    Hypertension Brother     Diabetes Sister    Hypertension Sister    Stroke Sister    Heart disease Brother     Past Surgical History:  Procedure Laterality Date   ABDOMINAL HYSTERECTOMY  [id number]   GALLBLADDER SURGERY  [id number]   Social History   Occupational History   Not on file  Tobacco Use   Smoking status: Never   Smokeless tobacco: Never  Substance and Sexual Activity   Alcohol use: Yes    Alcohol/week: 1.0 standard drink    Types: 1 Glasses of wine per week   Drug use: Not on file   Sexual activity: Not on file

## 2021-10-22 ENCOUNTER — Other Ambulatory Visit: Payer: Self-pay

## 2021-10-22 ENCOUNTER — Other Ambulatory Visit: Payer: Medicare HMO | Admitting: Internal Medicine

## 2021-10-22 DIAGNOSIS — E118 Type 2 diabetes mellitus with unspecified complications: Secondary | ICD-10-CM

## 2021-10-22 DIAGNOSIS — E1169 Type 2 diabetes mellitus with other specified complication: Secondary | ICD-10-CM | POA: Diagnosis not present

## 2021-10-22 DIAGNOSIS — Z1329 Encounter for screening for other suspected endocrine disorder: Secondary | ICD-10-CM

## 2021-10-22 DIAGNOSIS — D509 Iron deficiency anemia, unspecified: Secondary | ICD-10-CM | POA: Diagnosis not present

## 2021-10-22 DIAGNOSIS — I10 Essential (primary) hypertension: Secondary | ICD-10-CM | POA: Diagnosis not present

## 2021-10-22 DIAGNOSIS — E785 Hyperlipidemia, unspecified: Secondary | ICD-10-CM | POA: Diagnosis not present

## 2021-10-24 LAB — IRON,TIBC AND FERRITIN PANEL
%SAT: 24 % (calc) (ref 16–45)
Ferritin: 163 ng/mL (ref 16–288)
Iron: 68 ug/dL (ref 45–160)
TIBC: 282 mcg/dL (calc) (ref 250–450)

## 2021-10-24 LAB — COMPLETE METABOLIC PANEL WITH GFR
AG Ratio: 1.7 (calc) (ref 1.0–2.5)
ALT: 27 U/L (ref 6–29)
AST: 21 U/L (ref 10–35)
Albumin: 4.3 g/dL (ref 3.6–5.1)
Alkaline phosphatase (APISO): 94 U/L (ref 37–153)
BUN/Creatinine Ratio: 16 (calc) (ref 6–22)
BUN: 17 mg/dL (ref 7–25)
CO2: 21 mmol/L (ref 20–32)
Calcium: 9.8 mg/dL (ref 8.6–10.4)
Chloride: 106 mmol/L (ref 98–110)
Creat: 1.05 mg/dL — ABNORMAL HIGH (ref 0.60–1.00)
Globulin: 2.6 g/dL (calc) (ref 1.9–3.7)
Glucose, Bld: 98 mg/dL (ref 65–99)
Potassium: 5.1 mmol/L (ref 3.5–5.3)
Sodium: 140 mmol/L (ref 135–146)
Total Bilirubin: 0.3 mg/dL (ref 0.2–1.2)
Total Protein: 6.9 g/dL (ref 6.1–8.1)
eGFR: 55 mL/min/{1.73_m2} — ABNORMAL LOW (ref >=60)

## 2021-10-24 LAB — CBC WITH DIFFERENTIAL/PLATELET
Absolute Monocytes: 407 cells/uL (ref 200–950)
Basophils Absolute: 59 cells/uL (ref 0–200)
Basophils Relative: 1.2 %
Eosinophils Absolute: 221 cells/uL (ref 15–500)
Eosinophils Relative: 4.5 %
HCT: 33.8 % — ABNORMAL LOW (ref 35.0–45.0)
Hemoglobin: 11.3 g/dL — ABNORMAL LOW (ref 11.7–15.5)
Lymphs Abs: 2014 cells/uL (ref 850–3900)
MCH: 28.7 pg (ref 27.0–33.0)
MCHC: 33.4 g/dL (ref 32.0–36.0)
MCV: 85.8 fL (ref 80.0–100.0)
MPV: 11 fL (ref 7.5–12.5)
Monocytes Relative: 8.3 %
Neutro Abs: 2200 cells/uL (ref 1500–7800)
Neutrophils Relative %: 44.9 %
Platelets: 219 10*3/uL (ref 140–400)
RBC: 3.94 10*6/uL (ref 3.80–5.10)
RDW: 12.7 % (ref 11.0–15.0)
Total Lymphocyte: 41.1 %
WBC: 4.9 10*3/uL (ref 3.8–10.8)

## 2021-10-24 LAB — HEMOGLOBIN A1C
Hgb A1c MFr Bld: 5.6 % of total Hgb (ref <5.7)
Mean Plasma Glucose: 114 mg/dL
eAG (mmol/L): 6.3 mmol/L

## 2021-10-24 LAB — LIPID PANEL
Cholesterol: 256 mg/dL — ABNORMAL HIGH (ref <200)
HDL: 110 mg/dL (ref >=50)
LDL Cholesterol (Calc): 129 mg/dL (calc) — ABNORMAL HIGH
Non-HDL Cholesterol (Calc): 146 mg/dL (calc) — ABNORMAL HIGH (ref <130)
Total CHOL/HDL Ratio: 2.3 (calc) (ref <5.0)
Triglycerides: 77 mg/dL (ref <150)

## 2021-10-24 LAB — TSH: TSH: 1.21 mIU/L (ref 0.40–4.50)

## 2021-10-27 ENCOUNTER — Ambulatory Visit (INDEPENDENT_AMBULATORY_CARE_PROVIDER_SITE_OTHER): Payer: Medicare HMO | Admitting: Internal Medicine

## 2021-10-27 ENCOUNTER — Other Ambulatory Visit: Payer: Self-pay

## 2021-10-27 ENCOUNTER — Encounter: Payer: Self-pay | Admitting: Internal Medicine

## 2021-10-27 VITALS — BP 126/80 | HR 64 | Temp 98.2°F | Resp 16 | Ht 61.0 in | Wt 204.0 lb

## 2021-10-27 DIAGNOSIS — Z0000 Encounter for general adult medical examination without abnormal findings: Secondary | ICD-10-CM | POA: Diagnosis not present

## 2021-10-27 DIAGNOSIS — E1169 Type 2 diabetes mellitus with other specified complication: Secondary | ICD-10-CM

## 2021-10-27 DIAGNOSIS — N1831 Chronic kidney disease, stage 3a: Secondary | ICD-10-CM | POA: Diagnosis not present

## 2021-10-27 DIAGNOSIS — E785 Hyperlipidemia, unspecified: Secondary | ICD-10-CM

## 2021-10-27 DIAGNOSIS — Z23 Encounter for immunization: Secondary | ICD-10-CM

## 2021-10-27 DIAGNOSIS — I10 Essential (primary) hypertension: Secondary | ICD-10-CM | POA: Diagnosis not present

## 2021-10-27 LAB — POCT URINALYSIS DIPSTICK
Bilirubin, UA: NEGATIVE
Blood, UA: NEGATIVE
Glucose, UA: NEGATIVE
Ketones, UA: NEGATIVE
Leukocytes, UA: NEGATIVE
Nitrite, UA: NEGATIVE
Protein, UA: NEGATIVE
Spec Grav, UA: 1.015 (ref 1.010–1.025)
Urobilinogen, UA: 0.2 E.U./dL
pH, UA: 5 (ref 5.0–8.0)

## 2021-10-27 MED ORDER — ALBUTEROL SULFATE HFA 108 (90 BASE) MCG/ACT IN AERS: 2.0000 | INHALATION_SPRAY | Freq: Four times a day (QID) | RESPIRATORY_TRACT | 2 refills | Status: DC | PRN

## 2021-10-27 NOTE — Progress Notes (Signed)
Annual Wellness Visit     Patient: Sharon Russell, Female    DOB: 21-Sep-1946, 75 y.o.   MRN: [id number] Visit Date: 10/27/2021  Chief Complaint  Patient presents with   Medicare Wellness   Subjective    Sharon Russell is a 75 y.o. female who presents today for her Annual Wellness Visit.  HPI 75 year old Female seen for health maintenance exam and evaluation of medical issues in addition to Medicare wellness visit.  Hx hyperlipidemia but only taking 10 mg daily of simvastatin.  Needs to be on 20 mg daily.  Total cholesterol is 256 and LDL is 129.  She has been followed here since 04/12/2011.  Has been hypertensive for some 7 years prior to 2011/04/12.  History of increased serum creatinine likely due to chronic kidney disease.  Was seen by [facility] in 04/11/2018 and it was recommended that Mobic be discontinued.  Had slight right-sided hydronephrosis on ultrasound in 2018/04/11.  Was diagnosed with stage IIIa chronic kidney disease.  Currently creatinine is 1.05 and has been as high as 1.43 in April 11, 2018.  Last seen by [facility] in December 2021 with 1 year follow-up recommended.  Had COVID-19 in September 2021 and recovered well at home.  Had Cologuard done in January 2021 and repeat study recommended in 3 years.  Had colonoscopy in 2009-04-11.  Had mammogram in December 2021.  Had abdominal hysterectomy without oophorectomy in 04/11/1989 for fibroids.  Cholecystectomy 04/11/2008.  History of E. coli UTI in 04-11-18.  Diabetes mellitus is stable with hemoglobin A1c excellent at 5.6%.  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  Uses Xalatan drops in both eyes.  History of E. coli UTI in April 11, 2018.  Bone density study done at [facility] in April 11, 2014 with lowest T score -1.5.  History of osteoarthritis of her knees and has seen orthopedist for knee injections.  Last seen at orthopedist February 2021 and was told she can  have injections every 6 months.  Social history: She is a widow.  Husband passed away in early 04/11/16 of a sudden MI.  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  She is now retired.  Family history: Father died of an apparent cardiac arrest with history of pacemaker and was 30 years old.  Mother died at age 49 in her sleep.  3 brothers.  One is living.  1 died of complications of alcoholism with history of seizure disorder.  Another brother died at age 33 of an MI.  Patient had a total of 3 sisters, one of them died of a stroke and 2 sisters are living.  Mother had history of diabetes and hypertension.  1 brother and 1 sister with hypertension.  Patient Care Team: Elby Showers, MD as PCP - General (Internal Medicine)  Review of Systems rides stationary bike 15 min twice a week. Needs to walk daily   Objective    Vitals  Physical Exam blood pressure 126/80 pulse 64 respiratory rate 16 temperature 98.2 degrees pulse oximetry 98% weight 204 pounds height 5 feet 1 inches  Skin: Warm and dry.  No thyromegaly.  No carotid bruits.  Chest is clear to auscultation.  Breasts are without masses.  Cardiac exam: Regular rate and rhythm without ectopy.  Abdomen soft nondistended without hepatosplenomegaly masses or tenderness.  Trace  lower extremity edema.  Neuro is intact without gross focal deficits.  Affect thought and judgment are normal.   Most recent functional status assessment: In your present state of health, do you have any difficulty performing the following activities: 10/27/2021  Hearing? N  Vision? N  Difficulty concentrating or making decisions? N  Walking or climbing stairs? N  Dressing or bathing? N  Doing errands, shopping? N  Preparing Food and eating ? N  Using the Toilet? N  In the past six months, have you accidently leaked urine? N  Do you have problems with loss of bowel control? N  Managing  your Medications? N  Managing your Finances? N  Housekeeping or managing your Housekeeping? N  Some recent data might be hidden   Most recent fall risk assessment: Fall Risk  10/27/2021  Falls in the past year? 0  Number falls in past yr: 0  Injury with Fall? 0  Risk for fall due to : No Fall Risks  Follow up Falls evaluation completed    Most recent depression screenings: PHQ 2/9 Scores 10/27/2021 04/23/2021  PHQ - 2 Score 0 0   Most recent cognitive screening: 6CIT Screen 10/27/2021  What Year? 0 points  What month? 0 points  What time? 0 points  Count back from 20 0 points  Months in reverse 0 points  Repeat phrase 10 points  Total Score 10       Assessment & Plan     Annual wellness visit done today including the all of the following: Reviewed patient's Family Medical History Reviewed and updated list of patient's medical providers Assessment of cognitive impairment was done Assessed patient's functional ability Established a written schedule for health screening services Health Risk Assessent Completed and Reviewed  Discussed health benefits of physical activity, and encouraged her to engage in regular exercise appropriate for her age and condition.    Chronic kidney disease stage IIIa followed at [facility]  Osteoarthritis of knees-has had knee injections in the past  Diet-controlled diabetes mellitus  History of glaucoma followed at [facility]  Essential hypertension stable on current regimen treated with metoprolol, lisinopril and HCTZ and stable  Hyperlipidemia-should be taking statin medication daily-prefer that she take 20 not 10 mg daily.  May take coenzyme Q for myalgias.  She will follow-up in 3 months.  Impaired glucose tolerance treated with diet.  Hemoglobin A1c excellent at 5.6%.  Health maintenance-flu vaccine given.  Recommend COVID booster.  Plan: Continue current medications and return in 6 months.   Recommend walking some for exercise.  Patient complaining of myalgias on Zocor.  I do want her to take Zocor 20 mg daily not 10 mg daily.  Follow-up in 3 months.     {pI, Margaree Mackintosh, MD, have reviewed all documentation for this visit. The documentation on 10/27/21 for the exam, diagnosis, procedures, and orders are all accurate and complete.   Jama Flavors, CMA

## 2021-10-27 NOTE — Patient Instructions (Addendum)
Take Coenzyme Q daily for myalgias. Take Zocor 20mg  daily not 10 mg daily. RTC in 3 months. Albuterol refilled. Flu vaccine given. Walk some and ride bike. Have Covid booster.

## 2021-11-09 ENCOUNTER — Ambulatory Visit (INDEPENDENT_AMBULATORY_CARE_PROVIDER_SITE_OTHER): Payer: Medicare HMO | Admitting: Internal Medicine

## 2021-11-09 ENCOUNTER — Telehealth: Payer: Self-pay | Admitting: Internal Medicine

## 2021-11-09 ENCOUNTER — Encounter: Payer: Self-pay | Admitting: Internal Medicine

## 2021-11-09 ENCOUNTER — Other Ambulatory Visit: Payer: Self-pay

## 2021-11-09 VITALS — BP 112/66 | HR 79 | Temp 98.5°F | Ht 61.0 in | Wt 203.5 lb

## 2021-11-09 DIAGNOSIS — R319 Hematuria, unspecified: Secondary | ICD-10-CM | POA: Diagnosis not present

## 2021-11-09 DIAGNOSIS — E1169 Type 2 diabetes mellitus with other specified complication: Secondary | ICD-10-CM

## 2021-11-09 DIAGNOSIS — I10 Essential (primary) hypertension: Secondary | ICD-10-CM

## 2021-11-09 DIAGNOSIS — N1831 Chronic kidney disease, stage 3a: Secondary | ICD-10-CM

## 2021-11-09 DIAGNOSIS — E785 Hyperlipidemia, unspecified: Secondary | ICD-10-CM

## 2021-11-09 DIAGNOSIS — N390 Urinary tract infection, site not specified: Secondary | ICD-10-CM | POA: Diagnosis not present

## 2021-11-09 DIAGNOSIS — R350 Frequency of micturition: Secondary | ICD-10-CM

## 2021-11-09 DIAGNOSIS — R109 Unspecified abdominal pain: Secondary | ICD-10-CM | POA: Diagnosis not present

## 2021-11-09 LAB — POCT URINALYSIS DIPSTICK
Bilirubin, UA: NEGATIVE
Glucose, UA: NEGATIVE
Ketones, UA: NEGATIVE
Nitrite, UA: NEGATIVE
Protein, UA: POSITIVE — AB
Spec Grav, UA: 1.01 (ref 1.010–1.025)
Urobilinogen, UA: 0.2 E.U./dL
pH, UA: 7 (ref 5.0–8.0)

## 2021-11-09 MED ORDER — CIPROFLOXACIN HCL 500 MG PO TABS: 500.0000 mg | ORAL_TABLET | Freq: Two times a day (BID) | ORAL | 0 refills | Status: AC

## 2021-11-09 NOTE — Telephone Encounter (Signed)
Sharon Russell [phone]  Kadence called to say she woke up Friday morning, she could not hardly walk, hurting all over, so she stopped taken the cholesterol medicine where she had increased from her last office visit with you. That is some better now.  She also woke up that morning with her stomach hurting and increased urination, lower back pain and right side pain, no burning. I scheduled her at 11:30 for UTI check.

## 2021-11-09 NOTE — Telephone Encounter (Signed)
Patient seen and urine sent for culture.

## 2021-11-10 NOTE — Patient Instructions (Signed)
We are sorry you are not feeling well.  Take Cipro 500 mg twice daily for 10 days.  Follow-up here in December 12 for recheck.  Urine culture obtained and results are pending.

## 2021-11-10 NOTE — Progress Notes (Signed)
   Subjective:    Patient ID: Sharon Russell, female    DOB: Jul 29, 1946, 75 y.o.   MRN: [id number]  HPI 75 year old Female in today with complaint of flank pain and urinary frequency.  She has a history of hypertension, chronic kidney disease, glaucoma.  History of E. coli UTI in 2019.  History of diet-controlled diabetes.  Dipstick UA is positive for protein.  Specific gravity 1.010.  Large occult blood noted.  Large (3+) LE noted as well.  Review of Systems no nausea, vomiting, shaking chills     Objective:   Physical Exam Blood pressure 112/66 pulse 79 temperature 98.5 degrees weight 203 pounds height 5 feet 1 inches BMI 38.45  No CVA tenderness.  Urine dipstick results reviewed.  Culture is pending.     Assessment & Plan:   Acute urinary tract infection.  History of E. coli urinary tract infection in 2019.  Controlled type 2 diabetes mellitus  Glaucoma  Hypertension  Chronic kidney disease stage III OA  Plan: Urine culture obtained.  Await results of culture.  Treat with Cipro 500 mg twice daily for 10 days.  Follow-up December 12 for recheck.

## 2021-11-11 NOTE — Progress Notes (Signed)
IMargaree Mackintosh, MD, have reviewed all documentation for this visit. The documentation on 11/11/21 for the exam, diagnosis, procedures, and orders are all accurate and complete.

## 2021-11-12 LAB — URINE CULTURE
MICRO NUMBER:: 12683536
SPECIMEN QUALITY:: ADEQUATE

## 2021-11-16 ENCOUNTER — Telehealth: Payer: Self-pay | Admitting: Internal Medicine

## 2021-11-16 MED ORDER — TERCONAZOLE 0.4 % VA CREA: 1.0000 | TOPICAL_CREAM | Freq: Every day | VAGINAL | 0 refills | Status: DC

## 2021-11-16 NOTE — Telephone Encounter (Addendum)
Sharon Russell [phone]  [name] called to say she is having some vaginal itching from the medicating she is taking from her visit last week. She said you told her to call if these symptoms started.   Please let her know a vaginal cream has been prescribed to use at bedtime in vagina for 7 days

## 2021-11-18 DIAGNOSIS — E669 Obesity, unspecified: Secondary | ICD-10-CM | POA: Diagnosis not present

## 2021-11-18 DIAGNOSIS — N183 Chronic kidney disease, stage 3 unspecified: Secondary | ICD-10-CM | POA: Diagnosis not present

## 2021-11-18 DIAGNOSIS — I129 Hypertensive chronic kidney disease with stage 1 through stage 4 chronic kidney disease, or unspecified chronic kidney disease: Secondary | ICD-10-CM | POA: Diagnosis not present

## 2021-11-18 DIAGNOSIS — R7302 Impaired glucose tolerance (oral): Secondary | ICD-10-CM | POA: Diagnosis not present

## 2021-11-18 DIAGNOSIS — N1330 Unspecified hydronephrosis: Secondary | ICD-10-CM | POA: Diagnosis not present

## 2021-11-22 ENCOUNTER — Other Ambulatory Visit: Payer: Self-pay | Admitting: Internal Medicine

## 2021-11-23 ENCOUNTER — Other Ambulatory Visit: Payer: Self-pay

## 2021-11-23 ENCOUNTER — Encounter: Payer: Self-pay | Admitting: Internal Medicine

## 2021-11-23 ENCOUNTER — Ambulatory Visit (INDEPENDENT_AMBULATORY_CARE_PROVIDER_SITE_OTHER): Payer: Medicare HMO | Admitting: Internal Medicine

## 2021-11-23 ENCOUNTER — Telehealth: Payer: Self-pay

## 2021-11-23 DIAGNOSIS — R350 Frequency of micturition: Secondary | ICD-10-CM | POA: Diagnosis not present

## 2021-11-23 LAB — POCT URINALYSIS DIPSTICK
Bilirubin, UA: NEGATIVE
Blood, UA: NEGATIVE
Glucose, UA: NEGATIVE
Ketones, UA: NEGATIVE
Leukocytes, UA: NEGATIVE
Nitrite, UA: NEGATIVE
Protein, UA: NEGATIVE
Spec Grav, UA: 1.02 (ref 1.010–1.025)
Urobilinogen, UA: 0.2 E.U./dL
pH, UA: 7 (ref 5.0–8.0)

## 2021-11-23 NOTE — Progress Notes (Signed)
IMargaree Mackintosh, MD, have reviewed all documentation for this visit. The documentation on 11/23/21 for the exam, diagnosis, procedures, and orders are all accurate and complete.  Patient presented for urine recheck. She has been notified it was normal.   She is here for nurse visit to follow up on E.coli UTI after being treated with a course of Cipro. Feeling better and no new complaints but did have vaginal itching presumably from antibiotic treatment and anti fungal medication was also sent in for her.   MJB. MD

## 2021-11-23 NOTE — Telephone Encounter (Signed)
VOB submitted for Monovisc, left knee. °BV pending. °

## 2021-11-24 ENCOUNTER — Telehealth: Payer: Self-pay

## 2021-11-24 NOTE — Telephone Encounter (Signed)
PA required for Monovisc, left knee. Faxed completed PA form to [facility] at [phone]. PA Pending.

## 2021-11-30 ENCOUNTER — Telehealth: Payer: Self-pay

## 2021-11-30 NOTE — Telephone Encounter (Signed)
Called and left a VM for patient to CB to schedule for gel injection.    Approved for Monovisc, left knee. Buy & Bill Patient will be responsible for 20% OOP. Co-pay of $20.00 No PA required per [facility] Reference# [id number]

## 2021-12-03 DIAGNOSIS — Z1231 Encounter for screening mammogram for malignant neoplasm of breast: Secondary | ICD-10-CM | POA: Diagnosis not present

## 2022-01-28 ENCOUNTER — Other Ambulatory Visit: Payer: Self-pay

## 2022-01-28 ENCOUNTER — Other Ambulatory Visit: Payer: Medicare HMO | Admitting: Internal Medicine

## 2022-01-28 DIAGNOSIS — E1169 Type 2 diabetes mellitus with other specified complication: Secondary | ICD-10-CM

## 2022-01-28 DIAGNOSIS — E785 Hyperlipidemia, unspecified: Secondary | ICD-10-CM | POA: Diagnosis not present

## 2022-01-28 LAB — LIPID PANEL
Cholesterol: 342 mg/dL — ABNORMAL HIGH (ref <200)
HDL: 108 mg/dL (ref >=50)
LDL Cholesterol (Calc): 210 mg/dL (calc) — ABNORMAL HIGH
Non-HDL Cholesterol (Calc): 234 mg/dL (calc) — ABNORMAL HIGH (ref <130)
Total CHOL/HDL Ratio: 3.2 (calc) (ref <5.0)
Triglycerides: 108 mg/dL (ref <150)

## 2022-01-28 LAB — HEPATIC FUNCTION PANEL
AG Ratio: 1.5 (calc) (ref 1.0–2.5)
ALT: 17 U/L (ref 6–29)
AST: 17 U/L (ref 10–35)
Albumin: 4.3 g/dL (ref 3.6–5.1)
Alkaline phosphatase (APISO): 92 U/L (ref 37–153)
Bilirubin, Direct: 0.1 mg/dL (ref 0.0–0.2)
Globulin: 2.8 g/dL (calc) (ref 1.9–3.7)
Indirect Bilirubin: 0.4 mg/dL (calc) (ref 0.2–1.2)
Total Bilirubin: 0.5 mg/dL (ref 0.2–1.2)
Total Protein: 7.1 g/dL (ref 6.1–8.1)

## 2022-01-29 ENCOUNTER — Encounter: Payer: Self-pay | Admitting: Internal Medicine

## 2022-01-29 ENCOUNTER — Ambulatory Visit (INDEPENDENT_AMBULATORY_CARE_PROVIDER_SITE_OTHER): Payer: Medicare HMO | Admitting: Internal Medicine

## 2022-01-29 VITALS — BP 138/62 | HR 62 | Temp 98.3°F | Ht 61.0 in | Wt 206.8 lb

## 2022-01-29 DIAGNOSIS — I10 Essential (primary) hypertension: Secondary | ICD-10-CM

## 2022-01-29 DIAGNOSIS — E785 Hyperlipidemia, unspecified: Secondary | ICD-10-CM | POA: Diagnosis not present

## 2022-01-29 DIAGNOSIS — E1169 Type 2 diabetes mellitus with other specified complication: Secondary | ICD-10-CM

## 2022-01-29 MED ORDER — EZETIMIBE 10 MG PO TABS: 10.0000 mg | ORAL_TABLET | Freq: Every day | ORAL | 3 refills | Status: DC

## 2022-01-29 MED ORDER — SIMVASTATIN 10 MG PO TABS: 10.0000 mg | ORAL_TABLET | Freq: Every day | ORAL | 1 refills | Status: DC

## 2022-01-29 NOTE — Progress Notes (Signed)
° °  Subjective:    Patient ID: Sharon Russell, female    DOB: 1946-10-03, 76 y.o.   MRN: [id number]  HPI 76 year old Female seen for follow up on hyperlipidemia. Has had issues with myalgias on daily statin medication. Says 20 mg dose  of  Simvastatin did cause myalgias. Previously recommended co-enzyme Q but she did not think it helped myalgias.  Have advised 10 mg of Zocor DAILY not every other day and have added Zetia. May need referral to lipid clinic. She will return here in June for follow up. Needs to walk for exercise.  Reminded about diabetic eye exam.  Asking about knee osteoarthritis. OK to have knees injected intermittently with steroids.  Apparently hyaluronic acid turned down by [employer].  Review of Systems  see above. No chest pain or SOB     Objective:   Physical Exam BP 138/62 pulse 62 T 98.3 pulse ox 98% weight 206 pounds BMI 39.07  needs to lose weight  Skin: warm and dry. No hot or red joints noted. Chest is clear Cor:RRR Ext without pitting edema        Assessment & Plan:  HTN- stable om current regimen conitnue to monitor at home  Hyperlipidemia- did not tolerate increased dose of statin. Add Zetia 10 mg daily and  Recheck in 3 months. If necessary, may need referral to lipid clinic  Osteoarthtitis of knees- ok to have knees injected with steroids from time to time. Apparently hyaluronic acid injections were not approved  by insurance company  HTN stable on Zestoretic and metoprolol  Hx of glaucoma- had diabetic eye exam October 2022

## 2022-01-29 NOTE — Patient Instructions (Addendum)
Unable to tolerate Simvastatin 20 mg daily due to myalgias. Go back to simvastatin 10 mg daily and add Zetia 10 mg daily. Follow up in 3 months. If no improvement at that time will be referred to lipid clinic. May continue with cortisone injectoons for knee arthritis per Dr. Magnus Ivan. Apparently hyaluronic acid injections were not approved by [employer]. Patient does not want knee replacement.

## 2022-02-17 ENCOUNTER — Telehealth: Payer: Self-pay | Admitting: Internal Medicine

## 2022-02-17 NOTE — Telephone Encounter (Signed)
LVM that appointment time change from 9:45 to 9:30. Call back to confirm. ?

## 2022-03-10 DIAGNOSIS — H40033 Anatomical narrow angle, bilateral: Secondary | ICD-10-CM | POA: Diagnosis not present

## 2022-03-10 DIAGNOSIS — H402231 Chronic angle-closure glaucoma, bilateral, mild stage: Secondary | ICD-10-CM | POA: Diagnosis not present

## 2022-03-14 ENCOUNTER — Other Ambulatory Visit: Payer: Self-pay | Admitting: Internal Medicine

## 2022-05-13 ENCOUNTER — Other Ambulatory Visit: Payer: Medicare HMO

## 2022-05-13 DIAGNOSIS — E1169 Type 2 diabetes mellitus with other specified complication: Secondary | ICD-10-CM

## 2022-05-13 DIAGNOSIS — E785 Hyperlipidemia, unspecified: Secondary | ICD-10-CM | POA: Diagnosis not present

## 2022-05-14 LAB — LIPID PANEL
Cholesterol: 208 mg/dL — ABNORMAL HIGH (ref <200)
HDL: 99 mg/dL (ref >=50)
LDL Cholesterol (Calc): 89 mg/dL (calc)
Non-HDL Cholesterol (Calc): 109 mg/dL (calc) (ref <130)
Total CHOL/HDL Ratio: 2.1 (calc) (ref <5.0)
Triglycerides: 102 mg/dL (ref <150)

## 2022-05-18 ENCOUNTER — Encounter: Payer: Self-pay | Admitting: Internal Medicine

## 2022-05-18 ENCOUNTER — Ambulatory Visit (INDEPENDENT_AMBULATORY_CARE_PROVIDER_SITE_OTHER): Payer: Medicare HMO | Admitting: Internal Medicine

## 2022-05-18 VITALS — BP 128/62 | HR 60 | Temp 97.5°F | Ht 61.0 in | Wt 208.0 lb

## 2022-05-18 DIAGNOSIS — I10 Essential (primary) hypertension: Secondary | ICD-10-CM

## 2022-05-18 DIAGNOSIS — M1712 Unilateral primary osteoarthritis, left knee: Secondary | ICD-10-CM | POA: Diagnosis not present

## 2022-05-18 DIAGNOSIS — R7302 Impaired glucose tolerance (oral): Secondary | ICD-10-CM | POA: Diagnosis not present

## 2022-05-18 DIAGNOSIS — E785 Hyperlipidemia, unspecified: Secondary | ICD-10-CM

## 2022-05-18 DIAGNOSIS — N1831 Chronic kidney disease, stage 3a: Secondary | ICD-10-CM | POA: Diagnosis not present

## 2022-05-18 DIAGNOSIS — E1169 Type 2 diabetes mellitus with other specified complication: Secondary | ICD-10-CM | POA: Diagnosis not present

## 2022-05-18 NOTE — Patient Instructions (Addendum)
Recommend Shingrix vaccine and tetanus vaccine at pharmacy.  Talked about diet and exercise. Has left knee osteoarthritis.  Does not want to see orthopedist at this time.  Will need COVID-vaccine in the Fall.  Due for Medicare wellness and health maintenance exam in 6 months.  Work on diet and exercise.  Has gained 4 pounds since last visit.  No change in medications.  Hemoglobin A1c stable at 5.9%

## 2022-05-18 NOTE — Progress Notes (Signed)
   Subjective:    Patient ID: Sharon Russell, female    DOB: 03-29-1946, 76 y.o.   MRN: [id number]  HPI 76 year old Female seen for  follow up. Left knee osteoarthritis prevents her from exercising much. Dies have stationary bike. Hgb AIC drawn today and pending. Lipids have improved on statin and Zetia. She thinks statin causes some myalgias. Takes it at night before bedtime and says it is tolerable. We will continue this for now.  In February total cholesterol was 342 and is now 208.  LDL cholesterol is 210 and is now 89.  Triglycerides are about the same.  They previously were 108 and are now 102.  HDL has gone down just a little from 10 8-99.  She does try to walk some with her sisters in [place] for exercise.  Otherwise due to knee pain is somewhat sedentary.  Blood pressure is stable on current regimen at 128/62.  Weight is 208 pounds.  Has gained 4 pounds since last visit.  Longstanding history of hypertension.  She has been followed here since 2012 and was hypertensive for some 7 years prior to that.  History of increased serum creatinine likely due to chronic kidney disease.  History of stage IIIa chronic kidney disease.  Has been seen by [facility] in 2019 and was recommended that Mobic be discontinued.   Discussed Shingrix vaccine and tetanus immunization update which should be obtained at pharmacy.   Review of Systems no new complaints other than left knee pain     Objective:   Physical Exam Blood pressure 128/62 pulse 60 temperature 97.5 degrees pulse oximetry 98% weight 208 pounds BMI 39.30  Skin: Warm and dry.  No cervical adenopathy or carotid bruits.  No thyromegaly.  Chest clear.  Cardiac exam: Regular rate and rhythm without ectopy.  No lower extremity pitting edema.  Total cholesterol was 208, HDL 99, triglycerides 102 and LDL cholesterol 89.     Assessment & Plan:  Pure hypercholesterolemia-marked improvement with adding low-dose statin medication to  Zetia.  History of chronic kidney disease stage IIIa-not evaluated with this visit  Essential hypertension-stable  Left knee osteoarthritis-does not want to see orthopedist regarding injection  Plan: Continue current medications.  Watch diet.  Try to lose a bit of weight and follow-up in 6 months which will be annual Medicare wellness visit and health maintenance exam.  Hemoglobin A1c drawn today and is pending.  Discussed Shingrix vaccine, COVID booster in the fall, discussed tetanus immunization update all of which can be obtained at local pharmacy.  Addendum: Hemoglobin A1c stable at 5.9%

## 2022-05-19 LAB — HEMOGLOBIN A1C
Hgb A1c MFr Bld: 5.9 % of total Hgb — ABNORMAL HIGH (ref <5.7)
Mean Plasma Glucose: 123 mg/dL
eAG (mmol/L): 6.8 mmol/L

## 2022-05-31 ENCOUNTER — Other Ambulatory Visit: Payer: Self-pay | Admitting: Internal Medicine

## 2022-05-31 DIAGNOSIS — E118 Type 2 diabetes mellitus with unspecified complications: Secondary | ICD-10-CM

## 2022-09-14 ENCOUNTER — Other Ambulatory Visit: Payer: Self-pay | Admitting: Internal Medicine

## 2022-10-26 ENCOUNTER — Other Ambulatory Visit: Payer: Medicare HMO

## 2022-10-26 DIAGNOSIS — R7989 Other specified abnormal findings of blood chemistry: Secondary | ICD-10-CM

## 2022-10-26 DIAGNOSIS — I10 Essential (primary) hypertension: Secondary | ICD-10-CM | POA: Diagnosis not present

## 2022-10-26 DIAGNOSIS — E1169 Type 2 diabetes mellitus with other specified complication: Secondary | ICD-10-CM

## 2022-10-26 DIAGNOSIS — R5383 Other fatigue: Secondary | ICD-10-CM

## 2022-10-26 DIAGNOSIS — R7302 Impaired glucose tolerance (oral): Secondary | ICD-10-CM

## 2022-10-26 DIAGNOSIS — E785 Hyperlipidemia, unspecified: Secondary | ICD-10-CM | POA: Diagnosis not present

## 2022-10-26 DIAGNOSIS — E8881 Metabolic syndrome: Secondary | ICD-10-CM | POA: Diagnosis not present

## 2022-10-26 LAB — COMPLETE METABOLIC PANEL WITH GFR
AG Ratio: 1.6 (calc) (ref 1.0–2.5)
ALT: 14 U/L (ref 6–29)
AST: 17 U/L (ref 10–35)
Albumin: 4.5 g/dL (ref 3.6–5.1)
Alkaline phosphatase (APISO): 92 U/L (ref 37–153)
BUN/Creatinine Ratio: 19 (calc) (ref 6–22)
BUN: 20 mg/dL (ref 7–25)
CO2: 26 mmol/L (ref 20–32)
Calcium: 10 mg/dL (ref 8.6–10.4)
Chloride: 103 mmol/L (ref 98–110)
Creat: 1.05 mg/dL — ABNORMAL HIGH (ref 0.60–1.00)
Globulin: 2.8 g/dL (calc) (ref 1.9–3.7)
Glucose, Bld: 107 mg/dL — ABNORMAL HIGH (ref 65–99)
Potassium: 4.4 mmol/L (ref 3.5–5.3)
Sodium: 139 mmol/L (ref 135–146)
Total Bilirubin: 0.7 mg/dL (ref 0.2–1.2)
Total Protein: 7.3 g/dL (ref 6.1–8.1)
eGFR: 54 mL/min/{1.73_m2} — ABNORMAL LOW (ref >=60)

## 2022-10-26 LAB — CBC WITH DIFFERENTIAL/PLATELET
Absolute Monocytes: 507 cells/uL (ref 200–950)
Basophils Absolute: 63 cells/uL (ref 0–200)
Basophils Relative: 1.1 %
Eosinophils Absolute: 308 cells/uL (ref 15–500)
Eosinophils Relative: 5.4 %
HCT: 34.9 % — ABNORMAL LOW (ref 35.0–45.0)
Hemoglobin: 11.6 g/dL — ABNORMAL LOW (ref 11.7–15.5)
Lymphs Abs: 3015 cells/uL (ref 850–3900)
MCH: 28.6 pg (ref 27.0–33.0)
MCHC: 33.2 g/dL (ref 32.0–36.0)
MCV: 86 fL (ref 80.0–100.0)
MPV: 10.8 fL (ref 7.5–12.5)
Monocytes Relative: 8.9 %
Neutro Abs: 1807 cells/uL (ref 1500–7800)
Neutrophils Relative %: 31.7 %
Platelets: 206 10*3/uL (ref 140–400)
RBC: 4.06 10*6/uL (ref 3.80–5.10)
RDW: 13 % (ref 11.0–15.0)
Total Lymphocyte: 52.9 %
WBC: 5.7 10*3/uL (ref 3.8–10.8)

## 2022-10-26 LAB — LIPID PANEL
Cholesterol: 260 mg/dL — ABNORMAL HIGH (ref <200)
HDL: 116 mg/dL (ref >=50)
LDL Cholesterol (Calc): 122 mg/dL (calc) — ABNORMAL HIGH
Non-HDL Cholesterol (Calc): 144 mg/dL (calc) — ABNORMAL HIGH (ref <130)
Total CHOL/HDL Ratio: 2.2 (calc) (ref <5.0)
Triglycerides: 112 mg/dL (ref <150)

## 2022-10-26 LAB — HEMOGLOBIN A1C
Hgb A1c MFr Bld: 5.9 % of total Hgb — ABNORMAL HIGH (ref <5.7)
Mean Plasma Glucose: 123 mg/dL
eAG (mmol/L): 6.8 mmol/L

## 2022-10-26 LAB — TSH: TSH: 1.13 mIU/L (ref 0.40–4.50)

## 2022-10-28 ENCOUNTER — Telehealth: Payer: Self-pay | Admitting: Internal Medicine

## 2022-10-28 ENCOUNTER — Ambulatory Visit (INDEPENDENT_AMBULATORY_CARE_PROVIDER_SITE_OTHER): Payer: Medicare HMO | Admitting: Internal Medicine

## 2022-10-28 ENCOUNTER — Encounter: Payer: Self-pay | Admitting: Internal Medicine

## 2022-10-28 VITALS — BP 132/70 | HR 65 | Temp 98.3°F | Ht 61.0 in | Wt 196.8 lb

## 2022-10-28 DIAGNOSIS — H401111 Primary open-angle glaucoma, right eye, mild stage: Secondary | ICD-10-CM

## 2022-10-28 DIAGNOSIS — Z0000 Encounter for general adult medical examination without abnormal findings: Secondary | ICD-10-CM | POA: Diagnosis not present

## 2022-10-28 DIAGNOSIS — M10061 Idiopathic gout, right knee: Secondary | ICD-10-CM | POA: Diagnosis not present

## 2022-10-28 DIAGNOSIS — M109 Gout, unspecified: Secondary | ICD-10-CM | POA: Diagnosis not present

## 2022-10-28 DIAGNOSIS — M25562 Pain in left knee: Secondary | ICD-10-CM

## 2022-10-28 DIAGNOSIS — E1169 Type 2 diabetes mellitus with other specified complication: Secondary | ICD-10-CM

## 2022-10-28 DIAGNOSIS — N1831 Chronic kidney disease, stage 3a: Secondary | ICD-10-CM | POA: Diagnosis not present

## 2022-10-28 DIAGNOSIS — M25561 Pain in right knee: Secondary | ICD-10-CM

## 2022-10-28 DIAGNOSIS — E785 Hyperlipidemia, unspecified: Secondary | ICD-10-CM | POA: Diagnosis not present

## 2022-10-28 DIAGNOSIS — Z23 Encounter for immunization: Secondary | ICD-10-CM | POA: Diagnosis not present

## 2022-10-28 DIAGNOSIS — M10071 Idiopathic gout, right ankle and foot: Secondary | ICD-10-CM

## 2022-10-28 DIAGNOSIS — M1712 Unilateral primary osteoarthritis, left knee: Secondary | ICD-10-CM

## 2022-10-28 DIAGNOSIS — E8881 Metabolic syndrome: Secondary | ICD-10-CM

## 2022-10-28 DIAGNOSIS — I10 Essential (primary) hypertension: Secondary | ICD-10-CM | POA: Diagnosis not present

## 2022-10-28 LAB — POCT URINALYSIS DIPSTICK
Bilirubin, UA: NEGATIVE
Blood, UA: NEGATIVE
Glucose, UA: NEGATIVE
Ketones, UA: NEGATIVE
Leukocytes, UA: NEGATIVE
Nitrite, UA: NEGATIVE
Protein, UA: NEGATIVE
Spec Grav, UA: 1.01 (ref 1.010–1.025)
Urobilinogen, UA: 0.2 E.U./dL
pH, UA: 5 (ref 5.0–8.0)

## 2022-10-28 MED ORDER — INDOMETHACIN 50 MG PO CAPS: 50.0000 mg | ORAL_CAPSULE | Freq: Three times a day (TID) | ORAL | 0 refills | Status: DC

## 2022-10-28 NOTE — Telephone Encounter (Signed)
Sharon Russell was calling to see if her medication had been called in and she wants to make sure it is called in to   [facility] on [address].

## 2022-10-28 NOTE — Patient Instructions (Addendum)
You have been diagnosed with gout. We have prescribed a  non steroidal for what I believed to be gout.  Your creatinine is very slightly elevated at 1.05 we have also added a uric acid level to your labs. Given flu and pneumococcal 20 vaccines today. RTC in 6 months. Watch diet.

## 2022-10-28 NOTE — Progress Notes (Signed)
Annual Wellness Visit     Patient: Sharon Russell, Female    DOB: 08-25-1946, 76 y.o.   MRN: [id number] Visit Date: 10/28/2022   Subjective    Sharon Russell is a pleasant 76 y.o. female who presents today for her Annual Medicare wellness Visit.  She also presents for health maintenance exam and evaluation of medical issues.  She has been followed here since 03/27/11.  Had been hypertensive for some 7 years prior to that time.  History of increased serum creatinine likely due to chronic kidney disease.  Seen by [facility] in Mar 26, 2018 and recommended Mobic be discontinued.  Had slight right-sided hydronephrosis on ultrasound in 03-26-18.  Was diagnosed with stage IIIa chronic kidney disease.  Has not been back to nephrologist.  Had COVID-19 in September 2021.  Recovered at home and did well.  Cologuard was done in January 2021 and repeat suggested in 3 years.  Refilled Ventolin inhaler at her request.  History of abdominal hysterectomy without oophorectomy 1990 for fibroids.  Cholecystectomy March 26, 2008.  Had colonoscopy at [facility] in March 26, 2009 which was normal.  History of chronic left open-angle glaucoma, moderate stage and glaucoma mild stage of right eye followed by Dr. Loreta Ave at [facility].  She uses Xalatan eyedrops in both eyes.  History of E. coli UTI in 03-26-2018.  History of osteoarthritis of her knees and is seeing orthopedics for knee injections.  Social history: She is a widow.  Husband passed away in early 03/26/16 with sudden MI.  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  Family history: Father died of an apparent cardiac arrest with history of pacemaker and was 75 years old.  Mother died at age 6 in her sleep.  3 brothers.  One of them is living.  1 brother died of complications of alcoholism with history of seizure disorder.  Another brother died at age 76 of an MI.  Patient had 3  sisters one of them died of a stroke.  2 sisters living.  Mother had history of diabetes and hypertension.  1 brother and 1 sister with hypertension.        Patient Care Team: Margaree Mackintosh, MD as PCP - General (Internal Medicine)  Review of Systems   Objective    Vitals: Blood pressure 132/70, pulse 65, temperature 98.3 degrees pulse oximetry 99% weight 196 pounds 12.8 ounces height 5 feet 1 inches BMI 37.19  Physical Exam  Skin: Warm and dry.  Nodes none.  Neck supple.  No thyromegaly or carotid bruits.  Chest clear.  Abdomen soft nondistended without hepatosplenomegaly masses or tenderness.  Affect thought and judgment appear to be normal.  Brief neurological exam is intact without gross focal deficits.  No pitting edema of the lower extremities.  Most recent fall risk assessment:    10/28/2022    2:11 PM  Fall Risk   Falls in the past year? 0  Number falls in past yr: 0  Injury with Fall? 0  Risk for fall due to : No Fall Risks    Most recent depression screenings:    10/28/2022    2:11 PM 01/30/2022    5:40 PM  PHQ 2/9 Scores  PHQ - 2 Score 0 0   Most recent cognitive screening:    10/27/2021   10:18 AM  6CIT Screen  What Year? 0 points  What month? 0 points  What time? 0 points  Count back from 20 0 points  Months in reverse 0 points  Repeat phrase 10 points  Total Score 10 points       Assessment & Plan   BMI 37-needs to work on diet exercise and weight loss  Essential hypertension  History of elevated serum creatinine-mild and currently 1.05  Impaired glucose tolerance with hemoglobin A1c 5.9% and stable  Pure hypercholesterolemia with LDL 122.  She has a very high HDL of 116 and total cholesterol is high at 260 however her HDL is excellent and her triglycerides are normal.  This is one of the highest HDL's I have ever seen.  History of osteoarthritis both knees treated with injections of methylprednisolone by Dr. Magnus Ivan.    I believe she  has gout and uric acid was drawn on November 16 but now was not resulted.  We have never checked her uric acid before.  I think she has gout in right knee and right foot.  Treated her with Indocin 3 times daily for 10 days.  Plan: She will continue to monitor her Accu-Cheks.  She will continue with Zestoretic 20/25 daily.  Continue with Xalatan for glaucoma.  Continue with Zetia for hyperlipidemia.  Continue low-dose potassium supplement and simvastatin 10 mg daily  Return in 6 months.  Pneumonia 20 vaccine given.  Flu vaccine given.  Follow-up with Dr. Magnus Ivan regarding joint pain.  Take Indocin for no more than 10 days 3 times a day for what I believed to be gout.  Uric acid level is pending.      Annual wellness visit done today including the all of the following: Reviewed patient's Family Medical History Reviewed and updated list of patient's medical providers Assessment of cognitive impairment was done Assessed patient's functional ability Established a written schedule for health screening services Health Risk Assessent Completed and Reviewed  Discussed health benefits of physical activity, and encouraged her to engage in regular exercise appropriate for her age and condition.        IMargaree Mackintosh, MD, have reviewed all documentation for this visit. The documentation on 11/11/22 for the exam, diagnosis, procedures, and orders are all accurate and complete.    LaVon Philipp Deputy, CMA

## 2022-10-29 LAB — MICROALBUMIN / CREATININE URINE RATIO
Creatinine, Urine: 136 mg/dL (ref 20–275)
Microalb Creat Ratio: 33 mcg/mg creat — ABNORMAL HIGH (ref <30)
Microalb, Ur: 4.5 mg/dL

## 2022-11-10 ENCOUNTER — Telehealth: Payer: Self-pay

## 2022-11-10 ENCOUNTER — Ambulatory Visit (INDEPENDENT_AMBULATORY_CARE_PROVIDER_SITE_OTHER): Payer: Medicare HMO

## 2022-11-10 ENCOUNTER — Encounter: Payer: Self-pay | Admitting: Orthopaedic Surgery

## 2022-11-10 ENCOUNTER — Ambulatory Visit: Payer: Medicare HMO | Admitting: Orthopaedic Surgery

## 2022-11-10 VITALS — Wt 196.8 lb

## 2022-11-10 DIAGNOSIS — G8929 Other chronic pain: Secondary | ICD-10-CM | POA: Diagnosis not present

## 2022-11-10 DIAGNOSIS — M25562 Pain in left knee: Secondary | ICD-10-CM | POA: Diagnosis not present

## 2022-11-10 DIAGNOSIS — M25561 Pain in right knee: Secondary | ICD-10-CM

## 2022-11-10 DIAGNOSIS — M1711 Unilateral primary osteoarthritis, right knee: Secondary | ICD-10-CM | POA: Diagnosis not present

## 2022-11-10 DIAGNOSIS — M170 Bilateral primary osteoarthritis of knee: Secondary | ICD-10-CM | POA: Diagnosis not present

## 2022-11-10 DIAGNOSIS — M1712 Unilateral primary osteoarthritis, left knee: Secondary | ICD-10-CM | POA: Diagnosis not present

## 2022-11-10 MED ORDER — LIDOCAINE HCL 1 % IJ SOLN
3.0000 mL | INTRAMUSCULAR | Status: AC | PRN
  Administered 2022-11-10: 3 mL

## 2022-11-10 MED ORDER — METHYLPREDNISOLONE ACETATE 40 MG/ML IJ SUSP
40.0000 mg | INTRAMUSCULAR | Status: AC | PRN
  Administered 2022-11-10: 40 mg via INTRA_ARTICULAR

## 2022-11-10 NOTE — Progress Notes (Signed)
The patient is normally seen in the past.  She is 76 years old and does ambulate the cane.  She has bilateral knee pain with the right worse than the left.  We have seen her for both knees in the past and both knees have shown arthritic changes.  She has recently been told that she had gout in the foot.  She is still with a BMI of 37.  She does have chronic kidney disease so cannot take anti-inflammatories.  Both knees have varus malalignment are slightly warm but good range of motion.  Both knees have significant pain throughout the arc of motion with significant medial joint line tenderness and patellofemoral pain.  Standing AP and lateral of the right knee also shows left knee on the standing film AP film.  Both knees have arthritic changes with varus malalignment and significant medial joint space narrowing.  The right knee also has patellofemoral narrowing.  I did place a steroid injection in both knees today to treat the acute pain that she is having in her knees.  She is a candidate for hyaluronic acid for her knees to treat the pain from long-term osteoarthritis and we will see if we can get this approved and ordered for her.  She agrees with this treatment plan.  The only other option would be knee replacement surgery.     Procedure Note  Patient: Sharon Russell             Date of Birth: 07-12-1946           MRN: [id number]             Visit Date: 11/10/2022  Procedures: Visit Diagnoses:  1. Chronic pain of right knee   2. Chronic pain of left knee   3. Unilateral primary osteoarthritis, left knee   4. Unilateral primary osteoarthritis, right knee     Large Joint Inj: R knee on 11/10/2022 10:36 AM Indications: diagnostic evaluation and pain Details: 22 G 1.5 in needle, superolateral approach  Arthrogram: No  Medications: 3 mL lidocaine 1 %; 40 mg methylPREDNISolone acetate 40 MG/ML Outcome: tolerated well, no immediate complications Procedure, treatment alternatives, risks and  benefits explained, specific risks discussed. Consent was given by the patient. Immediately prior to procedure a time out was called to verify the correct patient, procedure, equipment, support staff and site/side marked as required. Patient was prepped and draped in the usual sterile fashion.    Large Joint Inj: L knee on 11/10/2022 10:36 AM Indications: diagnostic evaluation and pain Details: 22 G 1.5 in needle, superolateral approach  Arthrogram: No  Medications: 3 mL lidocaine 1 %; 40 mg methylPREDNISolone acetate 40 MG/ML Outcome: tolerated well, no immediate complications Procedure, treatment alternatives, risks and benefits explained, specific risks discussed. Consent was given by the patient. Immediately prior to procedure a time out was called to verify the correct patient, procedure, equipment, support staff and site/side marked as required. Patient was prepped and draped in the usual sterile fashion.

## 2022-11-10 NOTE — Telephone Encounter (Signed)
Bilateral knee gel injections 

## 2022-11-11 NOTE — Telephone Encounter (Signed)
VOB submitted for Monovisc, bilateral knee  

## 2022-11-12 ENCOUNTER — Telehealth: Payer: Self-pay

## 2022-11-12 NOTE — Telephone Encounter (Addendum)
Spoke with [facility] and a lab error was made on uric acid lab I called patient left voicemail to have patient return for lab appt  Patient returned call and was informed of error and stated she would come in for lab but wanted Dr.Baxley to know she was seen by a doctor and given injections and is feeling better and not sure if she needs the lab.  Please advise

## 2022-12-09 DIAGNOSIS — Z1231 Encounter for screening mammogram for malignant neoplasm of breast: Secondary | ICD-10-CM | POA: Diagnosis not present

## 2022-12-09 LAB — HM MAMMOGRAPHY

## 2022-12-28 DIAGNOSIS — N183 Chronic kidney disease, stage 3 unspecified: Secondary | ICD-10-CM | POA: Diagnosis not present

## 2022-12-28 DIAGNOSIS — R7302 Impaired glucose tolerance (oral): Secondary | ICD-10-CM | POA: Diagnosis not present

## 2022-12-28 DIAGNOSIS — E669 Obesity, unspecified: Secondary | ICD-10-CM | POA: Diagnosis not present

## 2022-12-28 DIAGNOSIS — I129 Hypertensive chronic kidney disease with stage 1 through stage 4 chronic kidney disease, or unspecified chronic kidney disease: Secondary | ICD-10-CM | POA: Diagnosis not present

## 2022-12-28 DIAGNOSIS — N1330 Unspecified hydronephrosis: Secondary | ICD-10-CM | POA: Diagnosis not present

## 2022-12-29 ENCOUNTER — Encounter: Payer: Self-pay | Admitting: Internal Medicine

## 2023-01-11 ENCOUNTER — Telehealth: Payer: Self-pay

## 2023-01-11 NOTE — Telephone Encounter (Signed)
Patient approved to have gel injection, but would need to resubmit due to last submission being done in 2023.

## 2023-03-28 ENCOUNTER — Other Ambulatory Visit: Payer: Self-pay

## 2023-03-28 ENCOUNTER — Other Ambulatory Visit: Payer: Self-pay | Admitting: Internal Medicine

## 2023-03-28 MED ORDER — TRUE METRIX AIR GLUCOSE METER DEVI: 3 refills | Status: AC

## 2023-04-21 NOTE — Progress Notes (Signed)
Patient Care Team: Margaree Mackintosh, MD as PCP - General (Internal Medicine)  Visit Date: 04/28/23  Subjective:    Patient ID: Sharon Russell , Female   DOB: Sep 29, 1946, 77 y.o.    MRN: [id number]   77 y.o. Female presents today for a 6 month follow-up.   Notes intermittent swelling in right leg. She is using ice. History of osteoarthritis in both knees treated with indomethacin 50 mg three times daily with meals. She had steroid injections in both knees from Dr. Magnus Ivan on 04/25/23 and she will be going back for gel injections.  History of hyperlipidemia treated with ezetimibe 10 mg daily, simvastatin 10 mg at bedtime. Cholesterol elevated at 224, down from 260. HGBA1c at 6.2% on 04/26/23, up from 5.9% on 10/26/22.  History of hypertension treated with metoprolol tartrate 100 mg twice daily, lisinopril-hydrochlorothiazide 20-25 mg daily. Blood pressure normal today at 130/76 on reassessment.  Liver, kidney functions normal.  Past Medical History:  Diagnosis Date   Arthritis    shoulder   Hypertension      Family History  Problem Relation Age of Onset   Diabetes Sister    Hypertension Sister    Hypertension Brother    Diabetes Sister    Hypertension Sister    Stroke Sister    Heart disease Brother     Social History   Social History Narrative   Not on file      Review of Systems  Constitutional:  Negative for fever and malaise/fatigue.  HENT:  Negative for congestion.   Eyes:  Negative for blurred vision.  Respiratory:  Negative for cough and shortness of breath.   Cardiovascular:  Positive for leg swelling. Negative for chest pain and palpitations.  Gastrointestinal:  Negative for vomiting.  Musculoskeletal:  Positive for joint pain (Both knees). Negative for back pain.  Skin:  Negative for rash.  Neurological:  Negative for loss of consciousness and headaches.        Objective:   Vitals: BP 136/80   Pulse 63   Temp 98.2 F (36.8 C) (Tympanic)   Ht 5'  1" (1.549 m)   Wt 202 lb (91.6 kg)   SpO2 98%   BMI 38.17 kg/m    Physical Exam Vitals and nursing note reviewed.  Constitutional:      General: She is not in acute distress.    Appearance: Normal appearance. She is not toxic-appearing.  HENT:     Head: Normocephalic and atraumatic.  Cardiovascular:     Rate and Rhythm: Normal rate and regular rhythm. No extrasystoles are present.    Pulses:          Dorsalis pedis pulses are 1+ on the right side and 1+ on the left side.     Heart sounds: Normal heart sounds. No murmur heard.    No friction rub. No gallop.  Pulmonary:     Effort: Pulmonary effort is normal. No respiratory distress.     Breath sounds: Normal breath sounds. No wheezing or rales.  Musculoskeletal:     Right knee: Crepitus present.     Left knee: Crepitus present.     Right lower leg: Edema present.     Left lower leg: Edema present.     Comments: Right straight leg raise test negative. Muscle strength 5/5 in all groups tested.  Skin:    General: Skin is warm and dry.  Neurological:     Mental Status: She is alert and oriented to person,  place, and time. Mental status is at baseline.  Psychiatric:        Mood and Affect: Mood normal.        Behavior: Behavior normal.        Thought Content: Thought content normal.        Judgment: Judgment normal.       Results:   Studies obtained and personally reviewed by me:    Labs:       Component Value Date/Time   NA 139 10/26/2022 0942   K 4.4 10/26/2022 0942   CL 103 10/26/2022 0942   CO2 26 10/26/2022 0942   GLUCOSE 107 (H) 10/26/2022 0942   BUN 20 10/26/2022 0942   CREATININE 1.05 (H) 10/26/2022 0942   CALCIUM 10.0 10/26/2022 0942   PROT 7.2 04/26/2023 0932   ALBUMIN 4.3 07/29/2017 0913   AST 16 04/26/2023 0932   ALT 12 04/26/2023 0932   ALKPHOS 109 07/29/2017 0913   BILITOT 0.5 04/26/2023 0932   GFRNONAA 52 (L) 10/20/2020 0938   GFRAA 60 10/20/2020 0938     Lab Results  Component Value  Date   WBC 5.7 10/26/2022   HGB 11.6 (L) 10/26/2022   HCT 34.9 (L) 10/26/2022   MCV 86.0 10/26/2022   PLT 206 10/26/2022    Lab Results  Component Value Date   CHOL 224 (H) 04/26/2023   HDL 119 04/26/2023   LDLCALC 91 04/26/2023   TRIG 56 04/26/2023   CHOLHDL 1.9 04/26/2023    Lab Results  Component Value Date   HGBA1C 6.2 (H) 04/26/2023     Lab Results  Component Value Date   TSH 1.13 10/26/2022      Assessment & Plan:   Osteoarthritis bilateral knees: treated with indomethacin 50 mg three times daily with meals. She had steroid injections in both knees by Dr. Magnus Ivan on 04/25/23 and she will be going back for gel injections.  Hyperlipidemia: treated with ezetimibe 10 mg daily, simvastatin 10 mg at bedtime. Discussed increasing simvastatin and she declined. Cholesterol elevated at 224, down from 260. HGBA1c at 6.2% on 04/26/23, up from 5.9% on 10/26/22.  Hypertension: treated with metoprolol tartrate 100 mg twice daily, lisinopril-hydrochlorothiazide 20-25 mg daily. Blood pressure normal today at 130/76.  Refilled Ventolin inhaler at her request. She uses this as needed for shortness of breath.  Vaccine counseling: she will go to the drug store for tetanus update.  Impaired glucose tolerance- Hgb AIC 6.2 % and was 5.9% in November. Continue diet and exercise efforts.Recheck in 6 months.  I,Alexander Ruley,acting as a Neurosurgeon for Margaree Mackintosh, MD.,have documented all relevant documentation on the behalf of Margaree Mackintosh, MD,as directed by  Margaree Mackintosh, MD while in the presence of Margaree Mackintosh, MD.   I, Margaree Mackintosh, MD, have reviewed all documentation for this visit. The documentation on 05/29/23 for the exam, diagnosis, procedures, and orders are all accurate and complete.

## 2023-04-25 ENCOUNTER — Ambulatory Visit: Payer: Medicare HMO | Admitting: Orthopaedic Surgery

## 2023-04-25 ENCOUNTER — Telehealth: Payer: Self-pay

## 2023-04-25 DIAGNOSIS — M25561 Pain in right knee: Secondary | ICD-10-CM

## 2023-04-25 DIAGNOSIS — G8929 Other chronic pain: Secondary | ICD-10-CM | POA: Diagnosis not present

## 2023-04-25 DIAGNOSIS — M1712 Unilateral primary osteoarthritis, left knee: Secondary | ICD-10-CM

## 2023-04-25 DIAGNOSIS — M1711 Unilateral primary osteoarthritis, right knee: Secondary | ICD-10-CM

## 2023-04-25 DIAGNOSIS — M25562 Pain in left knee: Secondary | ICD-10-CM | POA: Diagnosis not present

## 2023-04-25 DIAGNOSIS — M170 Bilateral primary osteoarthritis of knee: Secondary | ICD-10-CM | POA: Diagnosis not present

## 2023-04-25 MED ORDER — METHYLPREDNISOLONE ACETATE 40 MG/ML IJ SUSP
40.0000 mg | INTRAMUSCULAR | Status: AC | PRN
Start: 2023-04-25 — End: 2023-04-25
  Administered 2023-04-25: 40 mg via INTRA_ARTICULAR

## 2023-04-25 MED ORDER — LIDOCAINE HCL 1 % IJ SOLN
3.0000 mL | INTRAMUSCULAR | Status: AC | PRN
Start: 2023-04-25 — End: 2023-04-25
  Administered 2023-04-25: 3 mL

## 2023-04-25 NOTE — Telephone Encounter (Signed)
VOB submitted for Monovisc, bilateral knee  

## 2023-04-25 NOTE — Telephone Encounter (Signed)
Bilateral gel injections  

## 2023-04-25 NOTE — Progress Notes (Signed)
The patient has known and mild documented osteoarthritis in both her knees.  We have placed her injections in both knees about 6 months ago and has ordered hyaluronic acid for her knees but I think of some communication issues and she never came back for those.  She is still having significant bilateral knee pain.  She is 77 years old.  The knee on the right hurts worse than the left.  Again x-rays show significant tricompartment arthritis.  Examination of both knees today show crepitation of the patellofemoral joint with painful range of motion of both knees.  I did place a steroid injection of both knees today but she is definitely a candidate for hyaluronic acid for both knees.  Will see about ordering those for both knees.  All questions and concerns were answered and addressed.  At her next visit she definitely needs a weight and BMI calculation.  This patient is diagnosed with osteoarthritis of the knee(s).    Radiographs show evidence of joint space narrowing, osteophytes, subchondral sclerosis and/or subchondral cysts.  This patient has knee pain which interferes with functional and activities of daily living.    This patient has experienced inadequate response, adverse effects and/or intolerance with conservative treatments such as acetaminophen, NSAIDS, topical creams, physical therapy or regular exercise, knee bracing and/or weight loss.   This patient has experienced inadequate response or has a contraindication to intra articular steroid injections for at least 3 months.   This patient is not scheduled to have a total knee replacement within 6 months of starting treatment with viscosupplementation.     Procedure Note  Patient: Sharon Russell             Date of Birth: March 28, 1946           MRN: [id number]             Visit Date: 04/25/2023  Procedures: Visit Diagnoses:  1. Chronic pain of right knee   2. Chronic pain of left knee   3. Unilateral primary osteoarthritis, left knee    4. Unilateral primary osteoarthritis, right knee     Large Joint Inj: R knee on 04/25/2023 10:13 AM Indications: diagnostic evaluation and pain Details: 22 G 1.5 in needle, superolateral approach  Arthrogram: No  Medications: 3 mL lidocaine 1 %; 40 mg methylPREDNISolone acetate 40 MG/ML Outcome: tolerated well, no immediate complications Procedure, treatment alternatives, risks and benefits explained, specific risks discussed. Consent was given by the patient. Immediately prior to procedure a time out was called to verify the correct patient, procedure, equipment, support staff and site/side marked as required. Patient was prepped and draped in the usual sterile fashion.    Large Joint Inj: L knee on 04/25/2023 10:13 AM Indications: diagnostic evaluation and pain Details: 22 G 1.5 in needle, superolateral approach  Arthrogram: No  Medications: 3 mL lidocaine 1 %; 40 mg methylPREDNISolone acetate 40 MG/ML Outcome: tolerated well, no immediate complications Procedure, treatment alternatives, risks and benefits explained, specific risks discussed. Consent was given by the patient. Immediately prior to procedure a time out was called to verify the correct patient, procedure, equipment, support staff and site/side marked as required. Patient was prepped and draped in the usual sterile fashion.

## 2023-04-26 ENCOUNTER — Other Ambulatory Visit: Payer: Medicare HMO

## 2023-04-26 DIAGNOSIS — E785 Hyperlipidemia, unspecified: Secondary | ICD-10-CM | POA: Diagnosis not present

## 2023-04-26 DIAGNOSIS — E1169 Type 2 diabetes mellitus with other specified complication: Secondary | ICD-10-CM

## 2023-04-26 DIAGNOSIS — N1831 Chronic kidney disease, stage 3a: Secondary | ICD-10-CM

## 2023-04-27 LAB — HEPATIC FUNCTION PANEL
AG Ratio: 1.8 (calc) (ref 1.0–2.5)
ALT: 12 U/L (ref 6–29)
AST: 16 U/L (ref 10–35)
Albumin: 4.6 g/dL (ref 3.6–5.1)
Alkaline phosphatase (APISO): 107 U/L (ref 37–153)
Bilirubin, Direct: 0.1 mg/dL (ref 0.0–0.2)
Globulin: 2.6 g/dL (calc) (ref 1.9–3.7)
Indirect Bilirubin: 0.4 mg/dL (calc) (ref 0.2–1.2)
Total Bilirubin: 0.5 mg/dL (ref 0.2–1.2)
Total Protein: 7.2 g/dL (ref 6.1–8.1)

## 2023-04-27 LAB — HEMOGLOBIN A1C
Hgb A1c MFr Bld: 6.2 % of total Hgb — ABNORMAL HIGH (ref <5.7)
Mean Plasma Glucose: 131 mg/dL
eAG (mmol/L): 7.3 mmol/L

## 2023-04-27 LAB — LIPID PANEL
Cholesterol: 224 mg/dL — ABNORMAL HIGH (ref <200)
HDL: 119 mg/dL (ref >=50)
LDL Cholesterol (Calc): 91 mg/dL (calc)
Non-HDL Cholesterol (Calc): 105 mg/dL (calc) (ref <130)
Total CHOL/HDL Ratio: 1.9 (calc) (ref <5.0)
Triglycerides: 56 mg/dL (ref <150)

## 2023-04-27 LAB — MICROALBUMIN / CREATININE URINE RATIO
Creatinine, Urine: 93 mg/dL (ref 20–275)
Microalb Creat Ratio: 4 mg/g creat (ref <30)
Microalb, Ur: 0.4 mg/dL

## 2023-04-28 ENCOUNTER — Telehealth: Payer: Self-pay

## 2023-04-28 ENCOUNTER — Ambulatory Visit (INDEPENDENT_AMBULATORY_CARE_PROVIDER_SITE_OTHER): Payer: Medicare HMO | Admitting: Internal Medicine

## 2023-04-28 ENCOUNTER — Other Ambulatory Visit: Payer: Medicare HMO

## 2023-04-28 ENCOUNTER — Encounter: Payer: Self-pay | Admitting: Internal Medicine

## 2023-04-28 VITALS — BP 130/76 | HR 63 | Temp 98.2°F | Ht 61.0 in | Wt 202.0 lb

## 2023-04-28 DIAGNOSIS — E1169 Type 2 diabetes mellitus with other specified complication: Secondary | ICD-10-CM

## 2023-04-28 DIAGNOSIS — M7989 Other specified soft tissue disorders: Secondary | ICD-10-CM

## 2023-04-28 DIAGNOSIS — E785 Hyperlipidemia, unspecified: Secondary | ICD-10-CM

## 2023-04-28 DIAGNOSIS — M1712 Unilateral primary osteoarthritis, left knee: Secondary | ICD-10-CM

## 2023-04-28 DIAGNOSIS — M170 Bilateral primary osteoarthritis of knee: Secondary | ICD-10-CM

## 2023-04-28 DIAGNOSIS — I10 Essential (primary) hypertension: Secondary | ICD-10-CM | POA: Diagnosis not present

## 2023-04-28 DIAGNOSIS — M1711 Unilateral primary osteoarthritis, right knee: Secondary | ICD-10-CM | POA: Diagnosis not present

## 2023-04-28 DIAGNOSIS — Z136 Encounter for screening for cardiovascular disorders: Secondary | ICD-10-CM | POA: Diagnosis not present

## 2023-04-28 DIAGNOSIS — R7302 Impaired glucose tolerance (oral): Secondary | ICD-10-CM

## 2023-04-28 MED ORDER — ALBUTEROL SULFATE HFA 108 (90 BASE) MCG/ACT IN AERS: 2.0000 | INHALATION_SPRAY | Freq: Four times a day (QID) | RESPIRATORY_TRACT | 2 refills | Status: DC | PRN

## 2023-04-28 NOTE — Telephone Encounter (Signed)
Left voicemail for patient to call the office to have BNP lab(dx leg swelling) drawn it could not be added from prior labs

## 2023-04-28 NOTE — Telephone Encounter (Signed)
scheduled

## 2023-04-28 NOTE — Telephone Encounter (Signed)
Patient returned called and returned to have lab drawn

## 2023-04-28 NOTE — Patient Instructions (Addendum)
She is concerned about heart failure with her fullness around her knees and lower legs.  She does not have pitting edema.  A BNP will be added to lab work previously drawn.  I would like for her to work on diet and try to walk some if possible with her knees permit.  She is seeing Dr. Magnus Ivan regarding her knee issues.  Other labs are stable and she will follow-up in 6 months.  She does not want to increase her lipid-lowering medication at this time.  Medicare wellness visit and health maintenance exam due in 6 months.  Obtain tetanus immunization through pharmacy as she is on Medicare.  Addendum: Her BNP is normal and she does not have heart failure.  Needs to work on her glucose control and this will be reevaluated in 6 months time for Medicare wellness visit and health maintenance exam.  Her hemoglobin A1c is 6.2% and was 5.9% in November.

## 2023-04-29 LAB — BRAIN NATRIURETIC PEPTIDE: Brain Natriuretic Peptide: 41 pg/mL (ref <100)

## 2023-05-23 ENCOUNTER — Telehealth: Payer: Self-pay | Admitting: Internal Medicine

## 2023-05-23 NOTE — Telephone Encounter (Signed)
Sharon Russell [phone]  [name] called to say Friday when she was at the hairdressers, she got really hot while under the hair dryer, they turned it down she still got hot, she she got out from under it. While taking her curlers out, her head went back and she passed out for a few seconds, when she come to she was really sweating and had a headache. She stayed there till she felt okay to go home, now she is wandering if she needs to be seen.

## 2023-05-23 NOTE — Telephone Encounter (Signed)
Called patient to let her know, she said if it happens again, she will call back and come in.

## 2023-05-30 ENCOUNTER — Ambulatory Visit: Payer: Medicare HMO | Admitting: Orthopaedic Surgery

## 2023-05-30 ENCOUNTER — Other Ambulatory Visit: Payer: Self-pay

## 2023-05-30 ENCOUNTER — Telehealth: Payer: Self-pay

## 2023-05-30 ENCOUNTER — Encounter: Payer: Self-pay | Admitting: Orthopaedic Surgery

## 2023-05-30 VITALS — Ht 61.0 in | Wt 200.0 lb

## 2023-05-30 DIAGNOSIS — M170 Bilateral primary osteoarthritis of knee: Secondary | ICD-10-CM

## 2023-05-30 DIAGNOSIS — M1711 Unilateral primary osteoarthritis, right knee: Secondary | ICD-10-CM | POA: Diagnosis not present

## 2023-05-30 DIAGNOSIS — M1712 Unilateral primary osteoarthritis, left knee: Secondary | ICD-10-CM | POA: Diagnosis not present

## 2023-05-30 MED ORDER — HYALURONAN 88 MG/4ML IX SOSY
88.0000 mg | PREFILLED_SYRINGE | INTRA_ARTICULAR | Status: AC | PRN
Start: 2023-05-30 — End: 2023-05-30
  Administered 2023-05-30: 88 mg via INTRA_ARTICULAR

## 2023-05-30 NOTE — Telephone Encounter (Signed)
Approved for Monovisc-Bilateral knee Buy and Bill $10 copay Covered at 100% with no prior auth required

## 2023-05-30 NOTE — Progress Notes (Signed)
The patient comes in today after having steroid injections in both knees.  She is 77 years old and is on a weight loss journey.  Her BMI today is 37.79.  She is also here today for hyaluronic acid in both knees to treat the long-term pain from osteoarthritis.  The steroid injections have helped somewhat.  She is ambulate with a cane.  She understands that weight loss will help her quite a bit.  On exam both knees have slight varus malalignment but no effusion.  Both knees have excellent range of motion and are stable.  I did place Monovisc in both knees today which she tolerated well.  We had a conversation about things we need to bring her back.  She should still work on activity modification and weight loss.  We can always provide steroid injections in both knees again in September or October if needed.     Procedure Note  Patient: Sharon Russell             Date of Birth: March 16, 1946           MRN: [id number]             Visit Date: 05/30/2023  Procedures: Visit Diagnoses:  1. Unilateral primary osteoarthritis, right knee   2. Unilateral primary osteoarthritis, left knee     Large Joint Inj: bilateral knee on 05/30/2023 9:36 AM Indications: pain and diagnostic evaluation Details: 22 G 1.5 in needle, superolateral approach  Arthrogram: No  Medications (Right): 88 mg Hyaluronan 88 MG/4ML Medications (Left): 88 mg Hyaluronan 88 MG/4ML Outcome: tolerated well, no immediate complications Procedure, treatment alternatives, risks and benefits explained, specific risks discussed. Consent was given by the patient. Immediately prior to procedure a time out was called to verify the correct patient, procedure, equipment, support staff and site/side marked as required. Patient was prepped and draped in the usual sterile fashion.    Lot #[id number]

## 2023-08-22 ENCOUNTER — Other Ambulatory Visit: Payer: Self-pay | Admitting: Internal Medicine

## 2023-09-16 DIAGNOSIS — H40033 Anatomical narrow angle, bilateral: Secondary | ICD-10-CM | POA: Diagnosis not present

## 2023-09-16 DIAGNOSIS — H402231 Chronic angle-closure glaucoma, bilateral, mild stage: Secondary | ICD-10-CM | POA: Diagnosis not present

## 2023-09-16 DIAGNOSIS — H2513 Age-related nuclear cataract, bilateral: Secondary | ICD-10-CM | POA: Diagnosis not present

## 2023-11-01 NOTE — Progress Notes (Signed)
Annual Wellness Visit    Patient Care Team: Margaree Mackintosh, MD as PCP - General (Internal Medicine)  Visit Date: 11/08/23   Chief Complaint  Patient presents with   Medicare Wellness    Subjective:   Patient: Sharon Russell, Female    DOB: 11/23/1946, 77 y.o.   MRN: [id number]  Sharon Russell is a 77 y.o. Female who presents today for her Annual Wellness Visit. History of hypertension, anemia, impaired glucose tolerance, bilateral glaucoma, hyperlipidemia, osteoarthritis, asthma.  She has been followed here since December 20, 2011.  Had been hypertensive for some 7 years prior to that time.  History of increased serum creatinine likely due to chronic kidney disease.  Seen by [facility] in 2018/12/19 and recommended Mobic be discontinued.  Had slight right-sided hydronephrosis on ultrasound in 12-19-2018.  Was diagnosed with stage IIIa chronic kidney disease.  Has not been back to nephrologist.   History of anemia with hemoglobin low at 10.9 on 11/03/23, down from 11.6 on 10/26/22. She has been eating lighter recently. She does not eat much red meat. HCT low at 34. Denies melena.  History of impaired glucose tolerance. HGBA1c at 5.8% on 11/03/23, down from 6.2% on 04/26/23.  Had COVID-19 in September 2021.  Recovered at home and did well.    History of abdominal hysterectomy without oophorectomy 1990 for fibroids.  Cholecystectomy December 19, 2008.     History of chronic left open-angle glaucoma, moderate stage and glaucoma mild stage of right eye followed by Dr. Loreta Ave at [facility].  She uses Xalatan eyedrops in both eyes.   History of E. coli UTI in 2018/12/19.   History of osteoarthritis of her knees and is seeing orthopedics for knee injections. Had bilateral Monovisc injections by Dr. Magnus Ivan on 05/30/23.  History of hypertension treated with metoprolol tartrate 100 mg twice daily. Blood pressure normal today at 120/82.  History of hyperlipidemia treated with simvastatin 10 mg daily,  ezetimibe 10 mg daily. CHOL at 208, HDL at 106, LDL at 84.  History of asthma treated with albuterol inhaler as needed.  Recently had eye exam on 09/16/23.  11/03/23 labs reviewed today. Glucose normal. Kidney, liver functions normal. Blood proteins normal. Urine creatinine normal. TSH at 1.2.  Mammogram normal on 12/09/22.  Had colonoscopy at [facility] in 12-19-09 which was normal. Cologuard was done in January 2021 and repeat suggested in 3 years.  Social history: She is a widow.  Husband passed away in early 19-Dec-2016 with sudden MI.  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   Family history: Father died of an apparent cardiac arrest with history of pacemaker and was 11 years old.  Mother died at age 23 in her sleep.  3 brothers.  One of them is living.  1 brother died of complications of alcoholism with history of seizure disorder.  Another brother died at age 42 of an MI.  Patient had 3 sisters one of them died of a stroke.  2 sisters living.  Mother had history of diabetes and hypertension.  1 brother and 1 sister with hypertension.  Past Medical History:  Diagnosis Date   Arthritis    shoulder   Hypertension      Family History  Problem Relation Age of Onset   Diabetes Sister    Hypertension Sister    Hypertension Brother    Diabetes Sister    Hypertension Sister  Stroke Sister    Heart disease Brother      Social History   Social History Narrative   Not on file     Review of Systems  Constitutional:  Negative for chills, fever, malaise/fatigue and weight loss.  HENT:  Negative for hearing loss, sinus pain and sore throat.   Respiratory:  Negative for cough, hemoptysis and shortness of breath.   Cardiovascular:  Negative for chest pain, palpitations, leg swelling and PND.  Gastrointestinal:  Negative for abdominal pain, constipation, diarrhea, heartburn, nausea and vomiting.  Genitourinary:   Negative for dysuria, frequency and urgency.  Musculoskeletal:  Positive for joint pain (Left knee). Negative for back pain, myalgias and neck pain.  Skin:  Negative for itching and rash.  Neurological:  Negative for dizziness, tingling, seizures and headaches.  Endo/Heme/Allergies:  Negative for polydipsia.  Psychiatric/Behavioral:  Negative for depression. The patient is not nervous/anxious.       Objective:   Vitals: BP 120/82   Pulse 60   Ht 5\' 1"  (1.549 m)   Wt 205 lb (93 kg)   SpO2 97%   BMI 38.73 kg/m   Physical Exam Vitals and nursing note reviewed.  Constitutional:      General: She is not in acute distress.    Appearance: Normal appearance. She is not ill-appearing or toxic-appearing.  HENT:     Head: Normocephalic and atraumatic.     Right Ear: Hearing, tympanic membrane, ear canal and external ear normal.     Left Ear: Hearing, tympanic membrane, ear canal and external ear normal.     Mouth/Throat:     Pharynx: Oropharynx is clear.  Eyes:     Extraocular Movements: Extraocular movements intact.     Pupils: Pupils are equal, round, and reactive to light.  Neck:     Thyroid: No thyroid mass, thyromegaly or thyroid tenderness.     Vascular: No carotid bruit.  Cardiovascular:     Rate and Rhythm: Normal rate and regular rhythm. No extrasystoles are present.    Pulses:          Dorsalis pedis pulses are 1+ on the right side and 1+ on the left side.     Heart sounds: Normal heart sounds. No murmur heard.    No friction rub. No gallop.  Pulmonary:     Effort: Pulmonary effort is normal.     Breath sounds: Normal breath sounds. No decreased breath sounds, wheezing, rhonchi or rales.  Chest:     Chest wall: No mass.  Breasts:    Right: No mass.     Left: No mass.  Abdominal:     Palpations: Abdomen is soft. There is no hepatomegaly, splenomegaly or mass.     Tenderness: There is no abdominal tenderness.     Hernia: No hernia is present.  Genitourinary:     Comments: NFEG. Bimanual exam normal- rectovaginal confirms. No stool to guaiac. Musculoskeletal:     Cervical back: Normal range of motion.     Right lower leg: No edema.     Left lower leg: No edema.  Lymphadenopathy:     Cervical: No cervical adenopathy.     Upper Body:     Right upper body: No supraclavicular adenopathy.     Left upper body: No supraclavicular adenopathy.  Skin:    General: Skin is warm and dry.  Neurological:     General: No focal deficit present.     Mental Status: She is alert and oriented to person,  place, and time. Mental status is at baseline.     Sensory: Sensation is intact.     Motor: Motor function is intact. No weakness.     Deep Tendon Reflexes: Reflexes are normal and symmetric.  Psychiatric:        Attention and Perception: Attention normal.        Mood and Affect: Mood normal.        Speech: Speech normal.        Behavior: Behavior normal.        Thought Content: Thought content normal.        Cognition and Memory: Cognition normal.        Judgment: Judgment normal.      Most recent functional status assessment:    11/08/2023   10:06 AM  In your present state of health, do you have any difficulty performing the following activities:  Hearing? 0  Vision? 0  Difficulty concentrating or making decisions? 0  Walking or climbing stairs? 0  Dressing or bathing? 0  Doing errands, shopping? 0  Preparing Food and eating ? N  Using the Toilet? N  In the past six months, have you accidently leaked urine? N  Do you have problems with loss of bowel control? N  Managing your Medications? N  Managing your Finances? N  Housekeeping or managing your Housekeeping? N   Most recent fall risk assessment:    11/08/2023   10:07 AM  Fall Risk   Falls in the past year? 0  Number falls in past yr: 0  Injury with Fall? 0  Risk for fall due to : No Fall Risks  Follow up Education provided;Falls evaluation completed    Most recent depression  screenings:    11/08/2023   10:07 AM 04/28/2023    9:36 AM  PHQ 2/9 Scores  PHQ - 2 Score 0 0   Most recent cognitive screening:    11/08/2023   10:09 AM  6CIT Screen  What Year? 0 points  What month? 0 points  What time? 0 points  Count back from 20 0 points  Months in reverse 0 points  Repeat phrase 0 points  Total Score 0 points     Results:   Studies obtained and personally reviewed by me:  Mammogram normal on 12/09/22.  Had colonoscopy at [facility] in 2010 which was normal. Cologuard was done in January 2021 and repeat suggested in 3 years.  Labs:       Component Value Date/Time   NA 137 11/03/2023 0911   K 4.5 11/03/2023 0911   CL 102 11/03/2023 0911   CO2 24 11/03/2023 0911   GLUCOSE 92 11/03/2023 0911   BUN 21 11/03/2023 0911   CREATININE 0.96 11/03/2023 0911   CALCIUM 9.5 11/03/2023 0911   PROT 6.9 11/03/2023 0911   ALBUMIN 4.3 07/29/2017 0913   AST 16 11/03/2023 0911   ALT 13 11/03/2023 0911   ALKPHOS 109 07/29/2017 0913   BILITOT 0.4 11/03/2023 0911   GFRNONAA 52 (L) 10/20/2020 0938   GFRAA 60 10/20/2020 0938     Lab Results  Component Value Date   WBC 5.9 11/03/2023   HGB 10.9 (L) 11/03/2023   HCT 34.0 (L) 11/03/2023   MCV 86.7 11/03/2023   PLT 226 11/03/2023    Lab Results  Component Value Date   CHOL 208 (H) 11/03/2023   HDL 106 11/03/2023   LDLCALC 84 11/03/2023   TRIG 85 11/03/2023   CHOLHDL 2.0 11/03/2023  Lab Results  Component Value Date   HGBA1C 5.8 (H) 11/03/2023     Lab Results  Component Value Date   TSH 1.20 11/03/2023    Assessment & Plan:   Anemia: hemoglobin low at 10.9 on 11/03/23, down from 11.6 on 10/26/22. Energy has been decreased recently. Will continue to monitor.Advise multivitamin with iron daily. Cologard ordered.  Impaired glucose tolerance: currently diet and exercise controlled. HGBA1c at 5.8% on 11/03/23, down from 6.2% on 04/26/23.  Chronic left open-angle glaucoma, moderate stage and  glaucoma mild stage of right eye: followed by Dr. Loreta Ave at [facility].  She uses Xalatan eyedrops in both eyes.  Osteoarthritis  both  knees: seeing orthopedics for knee injections. Prescribed Voltaren 1% gel topically  Hypertension: treated with metoprolol tartrate 100 mg twice daily. Blood pressure normal in-office today at 120/82.  Hyperlipidemia: treated with simvastatin 10 mg daily, ezetimibe 10 mg daily. CHOL at 208, HDL at 106, LDL at 84.  Asthma: treated with albuterol inhaler as needed.  Recently had eye exam on 09/16/23.  Urinalysis normal today.  Mammogram normal on 12/09/22.  Had colonoscopy at [facility] in 2010 which was normal. Cologuard was done in January 2021 and repeat suggested in 3 years. Cologuard ordered.  Vaccine counseling: UTD on pneumococcal 20, tetanus vaccines. Administered flu booster.  Return in 1 year or as needed.     Annual wellness visit done today including the all of the following: Reviewed patient's Family Medical History Reviewed and updated list of patient's medical providers Assessment of cognitive impairment was done Assessed patient's functional ability Established a written schedule for health screening services Health Risk Assessent Completed and Reviewed  Discussed health benefits of physical activity, and encouraged her to engage in regular exercise appropriate for her age and condition.        I,Alexander Ruley,acting as a Neurosurgeon for Margaree Mackintosh, MD.,have documented all relevant documentation on the behalf of Margaree Mackintosh, MD,as directed by  Margaree Mackintosh, MD while in the presence of Margaree Mackintosh, MD.   I, Margaree Mackintosh, MD, have reviewed all documentation for this visit. The documentation on 11/12/23 for the exam, diagnosis, procedures, and orders are all accurate and complete.

## 2023-11-03 ENCOUNTER — Other Ambulatory Visit: Payer: Medicare HMO

## 2023-11-03 DIAGNOSIS — E1169 Type 2 diabetes mellitus with other specified complication: Secondary | ICD-10-CM

## 2023-11-03 DIAGNOSIS — E785 Hyperlipidemia, unspecified: Secondary | ICD-10-CM | POA: Diagnosis not present

## 2023-11-03 DIAGNOSIS — M1712 Unilateral primary osteoarthritis, left knee: Secondary | ICD-10-CM

## 2023-11-03 DIAGNOSIS — M8580 Other specified disorders of bone density and structure, unspecified site: Secondary | ICD-10-CM | POA: Diagnosis not present

## 2023-11-03 DIAGNOSIS — N1831 Chronic kidney disease, stage 3a: Secondary | ICD-10-CM | POA: Diagnosis not present

## 2023-11-03 DIAGNOSIS — I10 Essential (primary) hypertension: Secondary | ICD-10-CM | POA: Diagnosis not present

## 2023-11-03 DIAGNOSIS — Z0000 Encounter for general adult medical examination without abnormal findings: Secondary | ICD-10-CM | POA: Diagnosis not present

## 2023-11-03 DIAGNOSIS — D649 Anemia, unspecified: Secondary | ICD-10-CM

## 2023-11-03 DIAGNOSIS — E119 Type 2 diabetes mellitus without complications: Secondary | ICD-10-CM

## 2023-11-07 NOTE — Addendum Note (Signed)
Addended by: Gregery Na on: 11/07/2023 05:12 PM   Modules accepted: Orders

## 2023-11-08 ENCOUNTER — Ambulatory Visit (INDEPENDENT_AMBULATORY_CARE_PROVIDER_SITE_OTHER): Payer: Medicare HMO | Admitting: Internal Medicine

## 2023-11-08 ENCOUNTER — Encounter: Payer: Self-pay | Admitting: Internal Medicine

## 2023-11-08 VITALS — BP 120/82 | HR 60 | Ht 61.0 in | Wt 205.0 lb

## 2023-11-08 DIAGNOSIS — E1169 Type 2 diabetes mellitus with other specified complication: Secondary | ICD-10-CM

## 2023-11-08 DIAGNOSIS — H401111 Primary open-angle glaucoma, right eye, mild stage: Secondary | ICD-10-CM

## 2023-11-08 DIAGNOSIS — Z23 Encounter for immunization: Secondary | ICD-10-CM

## 2023-11-08 DIAGNOSIS — I10 Essential (primary) hypertension: Secondary | ICD-10-CM | POA: Diagnosis not present

## 2023-11-08 DIAGNOSIS — D649 Anemia, unspecified: Secondary | ICD-10-CM

## 2023-11-08 DIAGNOSIS — M10071 Idiopathic gout, right ankle and foot: Secondary | ICD-10-CM

## 2023-11-08 DIAGNOSIS — Z0000 Encounter for general adult medical examination without abnormal findings: Secondary | ICD-10-CM

## 2023-11-08 DIAGNOSIS — J4520 Mild intermittent asthma, uncomplicated: Secondary | ICD-10-CM

## 2023-11-08 DIAGNOSIS — Z1211 Encounter for screening for malignant neoplasm of colon: Secondary | ICD-10-CM

## 2023-11-08 DIAGNOSIS — M1711 Unilateral primary osteoarthritis, right knee: Secondary | ICD-10-CM | POA: Diagnosis not present

## 2023-11-08 DIAGNOSIS — E785 Hyperlipidemia, unspecified: Secondary | ICD-10-CM

## 2023-11-08 DIAGNOSIS — R7302 Impaired glucose tolerance (oral): Secondary | ICD-10-CM

## 2023-11-08 LAB — POCT URINALYSIS DIP (CLINITEK)
Bilirubin, UA: NEGATIVE
Blood, UA: NEGATIVE
Glucose, UA: NEGATIVE mg/dL
Ketones, POC UA: NEGATIVE mg/dL
Leukocytes, UA: NEGATIVE
Nitrite, UA: NEGATIVE
POC PROTEIN,UA: NEGATIVE
Spec Grav, UA: 1.015 (ref 1.010–1.025)
Urobilinogen, UA: 0.2 U/dL
pH, UA: 6 (ref 5.0–8.0)

## 2023-11-08 MED ORDER — DICLOFENAC SODIUM 1 % EX GEL: 2.0000 g | Freq: Four times a day (QID) | CUTANEOUS | 0 refills | Status: AC

## 2023-11-09 LAB — LIPID PANEL
Cholesterol: 208 mg/dL — ABNORMAL HIGH (ref <200)
HDL: 106 mg/dL (ref >=50)
LDL Cholesterol (Calc): 84 mg/dL
Non-HDL Cholesterol (Calc): 102 mg/dL (ref <130)
Total CHOL/HDL Ratio: 2 (calc) (ref <5.0)
Triglycerides: 85 mg/dL (ref <150)

## 2023-11-09 LAB — HEMOGLOBIN A1C
Hgb A1c MFr Bld: 5.8 %{Hb} — ABNORMAL HIGH (ref <5.7)
Mean Plasma Glucose: 120 mg/dL
eAG (mmol/L): 6.6 mmol/L

## 2023-11-09 LAB — CBC WITH DIFFERENTIAL/PLATELET
Absolute Lymphocytes: 2549 {cells}/uL (ref 850–3900)
Absolute Monocytes: 555 {cells}/uL (ref 200–950)
Basophils Absolute: 41 {cells}/uL (ref 0–200)
Basophils Relative: 0.7 %
Eosinophils Absolute: 325 {cells}/uL (ref 15–500)
Eosinophils Relative: 5.5 %
HCT: 34 % — ABNORMAL LOW (ref 35.0–45.0)
Hemoglobin: 10.9 g/dL — ABNORMAL LOW (ref 11.7–15.5)
MCH: 27.8 pg (ref 27.0–33.0)
MCHC: 32.1 g/dL (ref 32.0–36.0)
MCV: 86.7 fL (ref 80.0–100.0)
MPV: 10.9 fL (ref 7.5–12.5)
Monocytes Relative: 9.4 %
Neutro Abs: 2431 {cells}/uL (ref 1500–7800)
Neutrophils Relative %: 41.2 %
Platelets: 226 10*3/uL (ref 140–400)
RBC: 3.92 10*6/uL (ref 3.80–5.10)
RDW: 12.9 % (ref 11.0–15.0)
Total Lymphocyte: 43.2 %
WBC: 5.9 10*3/uL (ref 3.8–10.8)

## 2023-11-09 LAB — COMPLETE METABOLIC PANEL WITH GFR
AG Ratio: 1.7 (calc) (ref 1.0–2.5)
ALT: 13 U/L (ref 6–29)
AST: 16 U/L (ref 10–35)
Albumin: 4.3 g/dL (ref 3.6–5.1)
Alkaline phosphatase (APISO): 104 U/L (ref 37–153)
BUN: 21 mg/dL (ref 7–25)
CO2: 24 mmol/L (ref 20–32)
Calcium: 9.5 mg/dL (ref 8.6–10.4)
Chloride: 102 mmol/L (ref 98–110)
Creat: 0.96 mg/dL (ref 0.60–1.00)
Globulin: 2.6 g/dL (ref 1.9–3.7)
Glucose, Bld: 92 mg/dL (ref 65–99)
Potassium: 4.5 mmol/L (ref 3.5–5.3)
Sodium: 137 mmol/L (ref 135–146)
Total Bilirubin: 0.4 mg/dL (ref 0.2–1.2)
Total Protein: 6.9 g/dL (ref 6.1–8.1)
eGFR: 60 mL/min/{1.73_m2} (ref >=60)

## 2023-11-09 LAB — IRON,TIBC AND FERRITIN PANEL
%SAT: 23 % (ref 16–45)
Ferritin: 84 ng/mL (ref 16–288)
Iron: 68 ug/dL (ref 45–160)
TIBC: 298 ug/dL (ref 250–450)

## 2023-11-09 LAB — TSH: TSH: 1.2 m[IU]/L (ref 0.40–4.50)

## 2023-11-09 LAB — MICROALBUMIN / CREATININE URINE RATIO
Creatinine, Urine: 68 mg/dL (ref 20–275)
Microalb Creat Ratio: 10 mg/g{creat} (ref <30)
Microalb, Ur: 0.7 mg/dL

## 2023-11-09 LAB — FOLATE: Folate: 12.7 ng/mL

## 2023-11-09 LAB — VITAMIN B12: Vitamin B-12: 381 pg/mL (ref 200–1100)

## 2023-11-12 NOTE — Patient Instructions (Addendum)
It was a pleasure to see you today.  Recommend multivitamin with iron daily.  Cologuard ordered.  Hemoglobin A1c is excellent at 5.8%.  May try Voltaren gel topically for osteoarthritis of both knees.  Blood pressure is normal on current regimen of metoprolol.  Lipid panel is stable with 2 drugs.  May continue albuterol inhaler to use as needed for asthma symptoms.  Due to mild anemia will order Cologuard.  Patient may return in 6 to 12 months or as needed.

## 2023-11-14 ENCOUNTER — Ambulatory Visit: Payer: Medicare HMO | Admitting: Orthopaedic Surgery

## 2023-11-14 ENCOUNTER — Other Ambulatory Visit (INDEPENDENT_AMBULATORY_CARE_PROVIDER_SITE_OTHER): Payer: Medicare HMO

## 2023-11-14 ENCOUNTER — Encounter: Payer: Self-pay | Admitting: Orthopaedic Surgery

## 2023-11-14 DIAGNOSIS — M5441 Lumbago with sciatica, right side: Secondary | ICD-10-CM

## 2023-11-14 DIAGNOSIS — G8929 Other chronic pain: Secondary | ICD-10-CM | POA: Diagnosis not present

## 2023-11-14 MED ORDER — METHYLPREDNISOLONE 4 MG PO TABS: ORAL_TABLET | ORAL | 0 refills | Status: DC

## 2023-11-14 MED ORDER — TIZANIDINE HCL 2 MG PO TABS: 2.0000 mg | ORAL_TABLET | Freq: Three times a day (TID) | ORAL | 0 refills | Status: AC | PRN

## 2023-11-14 NOTE — Progress Notes (Signed)
The patient is a 77 year old female that we have seen in the past.  It has been just over 6 months since she had bilateral knee hyaluronic acid injections.  She comes in today with bilateral knee pain but also has been dealing with low back pain that radiates down her right leg with numbness and tingling in her right foot.  She is prediabetic.  She is not on any medications for diabetes.  Her last BMI was 38.73 just a week ago.  She denies any change in bowel or bladder function.  She denies any specific injury to her back and this has been going on off and on for about a month now in terms of her back.  She is requesting repeat hyaluronic acid injections in her knee since it has been just over 6 months and those have helped her.  Both knees were examined today.  Her left knee is much more painful than the right knee in terms of flexion and extension and pain over the medial compartment.  Her previous x-rays show severe arthritis of the left knee and moderate to severe arthritis of the right knee but is much worse on the left knee.  She does have limited flexion and extension of her lumbar spine secondary more so to her obesity.  Both her legs have 5 out of 5 strength but she has upon straight leg raise more on the left side but I think this is more from the her knee pain on the left side.  Her right side shows some just active numbness in the foot but this is minimal.  2 views of the lumbar spine show a grade 1 degenerative spondylolisthesis between L4 and L5.  There is also a mild compression deformity that appears chronic of L3.  She again denies any injury to her back.  I have no back x-rays to compare these to.  At this point I would like to order hyaluronic acid for her knee since that is helped her the most.  We will try steroid taper and a muscle relaxant for her back.  We will see how her back is feeling when she comes in for the bilateral knee hyaluronic acid injections.  She agrees with the  treatment plan.  This patient is diagnosed with osteoarthritis of the knee(s).    Radiographs show evidence of joint space narrowing, osteophytes, subchondral sclerosis and/or subchondral cysts.  This patient has knee pain which interferes with functional and activities of daily living.    This patient has experienced inadequate response, adverse effects and/or intolerance with conservative treatments such as acetaminophen, NSAIDS, topical creams, physical therapy or regular exercise, knee bracing and/or weight loss.   This patient has experienced inadequate response or has a contraindication to intra articular steroid injections for at least 3 months.   This patient is not scheduled to have a total knee replacement within 6 months of starting treatment with viscosupplementation.

## 2023-11-15 ENCOUNTER — Telehealth: Payer: Self-pay | Admitting: Radiology

## 2023-11-15 NOTE — Telephone Encounter (Signed)
BIL Knees hyaluronic acid

## 2023-11-18 DIAGNOSIS — H40063 Primary angle closure without glaucoma damage, bilateral: Secondary | ICD-10-CM | POA: Diagnosis not present

## 2023-11-18 DIAGNOSIS — H25813 Combined forms of age-related cataract, bilateral: Secondary | ICD-10-CM | POA: Diagnosis not present

## 2023-11-30 NOTE — Telephone Encounter (Signed)
VOB submitted for Monovisc, bilateral knee  

## 2023-12-05 ENCOUNTER — Other Ambulatory Visit: Payer: Self-pay

## 2023-12-05 DIAGNOSIS — M1711 Unilateral primary osteoarthritis, right knee: Secondary | ICD-10-CM

## 2023-12-05 DIAGNOSIS — M1712 Unilateral primary osteoarthritis, left knee: Secondary | ICD-10-CM

## 2023-12-09 ENCOUNTER — Encounter: Payer: Self-pay | Admitting: Physician Assistant

## 2023-12-09 ENCOUNTER — Ambulatory Visit: Payer: Medicare HMO | Admitting: Physician Assistant

## 2023-12-09 DIAGNOSIS — M1712 Unilateral primary osteoarthritis, left knee: Secondary | ICD-10-CM

## 2023-12-09 DIAGNOSIS — M170 Bilateral primary osteoarthritis of knee: Secondary | ICD-10-CM

## 2023-12-09 DIAGNOSIS — M1711 Unilateral primary osteoarthritis, right knee: Secondary | ICD-10-CM

## 2023-12-09 MED ORDER — HYALURONAN 88 MG/4ML IX SOSY
88.0000 mg | PREFILLED_SYRINGE | INTRA_ARTICULAR | Status: AC | PRN
  Administered 2023-12-09: 88 mg via INTRA_ARTICULAR

## 2023-12-09 NOTE — Progress Notes (Signed)
Office Visit Note   Patient: Sharon Russell           Date of Birth: 09/26/1946           MRN: [id number] Visit Date: 12/09/2023              Requested by: Margaree Mackintosh, MD [address] [place],  [place] [address] PCP: Margaree Mackintosh, MD  Osteoarthritis bilateral knees    HPI: Sharon Russell is a pleasant 77 year old woman with a history of osteoarthritis of bilateral knees.  Has had Monovisc in the past and thinks it helped.  Is coming in for new injection today  Assessment & Plan: Visit Diagnoses: Osteoarthritis bilateral knees  Plan: May follow-up with Dr. Magnus Ivan or Delane Ginger as needed  Follow-Up Instructions: Return if symptoms worsen or fail to improve.   Ortho Exam  Patient is alert, oriented, no adenopathy, well-dressed, normal affect, normal respiratory effort. Bilateral knees no effusion no erythema compartments are soft and compressible she is neurovascularly intact  Imaging: No results found. No images are attached to the encounter.  Labs: Lab Results  Component Value Date   HGBA1C 5.8 (H) 11/03/2023   HGBA1C 6.2 (H) 04/26/2023   HGBA1C 5.9 (H) 10/26/2022   LABORGA CITROBACTER KOSERI 08/01/2017     Lab Results  Component Value Date   ALBUMIN 4.3 07/29/2017   ALBUMIN 4.3 01/25/2017   ALBUMIN 4.6 07/27/2016    No results found for: "MG" Lab Results  Component Value Date   VD25OH 32 07/27/2016   VD25OH 37 07/24/2015   VD25OH 48 07/10/2013    No results found for: "PREALBUMIN"    Latest Ref Rng & Units 11/03/2023    9:11 AM 10/26/2022    9:42 AM 10/22/2021    9:24 AM  CBC EXTENDED  WBC 3.8 - 10.8 Thousand/uL 5.9  5.7  4.9   RBC 3.80 - 5.10 Million/uL 3.92  4.06  3.94   Hemoglobin 11.7 - 15.5 g/dL 36.6  44.0  34.7   HCT 35.0 - 45.0 % 34.0  34.9  33.8   Platelets 140 - 400 Thousand/uL 226  206  219   NEUT# 1,500 - 7,800 cells/uL 2,431  1,807  2,200   Lymph# 850 - 3,900 cells/uL  3,015  2,014      There is no height or weight on file to  calculate BMI.  Orders:  No orders of the defined types were placed in this encounter.  No orders of the defined types were placed in this encounter.    Procedures: Large Joint Inj: bilateral knee on 12/09/2023 10:07 AM Indications: pain and diagnostic evaluation Details: 22 G 1.5 in needle, anterolateral approach  Arthrogram: No  Medications (Right): 88 mg Hyaluronan 88 MG/4ML Medications (Left): 88 mg Hyaluronan 88 MG/4ML Outcome: tolerated well, no immediate complications Procedure, treatment alternatives, risks and benefits explained, specific risks discussed. Consent was given by the patient.    Clinical Data: No additional findings.  ROS:  All other systems negative, except as noted in the HPI. Review of Systems  Objective: Vital Signs: There were no vitals taken for this visit.  Specialty Comments:  No specialty comments available.  PMFS History: Patient Active Problem List   Diagnosis Date Noted  . Unilateral primary osteoarthritis, right knee 11/10/2022  . Anatomical narrow angle, bilateral 10/07/2018  . Impaired glucose tolerance 09/02/2018  . Chronic open angle glaucoma of both eyes 09/02/2018  . Osteopenia 09/02/2018  . Elevated serum creatinine 09/02/2018  . Unilateral  primary osteoarthritis, left knee 08/28/2018  . Unilateral primary osteoarthritis, left hip 07/31/2018  . Dependent edema 02/10/2016  . Metabolic syndrome 02/10/2016  . NS (nuclear sclerosis) 02/05/2015  . Hyperlipidemia 09/09/2011  . Hypertension 09/09/2011   Past Medical History:  Diagnosis Date  . Arthritis    shoulder  . Hypertension     Family History  Problem Relation Age of Onset  . Diabetes Sister   . Hypertension Sister   . Hypertension Brother   . Diabetes Sister   . Hypertension Sister   . Stroke Sister   . Heart disease Brother     Past Surgical History:  Procedure Laterality Date  . ABDOMINAL HYSTERECTOMY  [id number]  . GALLBLADDER SURGERY  [id number]    Social History   Occupational History  . Not on file  Tobacco Use  . Smoking status: Never  . Smokeless tobacco: Never  Substance and Sexual Activity  . Alcohol use: Yes    Alcohol/week: 1.0 standard drink of alcohol    Types: 1 Glasses of wine per week  . Drug use: Not on file  . Sexual activity: Not on file

## 2023-12-13 ENCOUNTER — Other Ambulatory Visit: Payer: Self-pay | Admitting: Internal Medicine

## 2023-12-20 DIAGNOSIS — Z1211 Encounter for screening for malignant neoplasm of colon: Secondary | ICD-10-CM | POA: Diagnosis not present

## 2023-12-23 DIAGNOSIS — Z1231 Encounter for screening mammogram for malignant neoplasm of breast: Secondary | ICD-10-CM | POA: Diagnosis not present

## 2023-12-23 LAB — HM MAMMOGRAPHY

## 2023-12-26 ENCOUNTER — Encounter: Payer: Self-pay | Admitting: Internal Medicine

## 2023-12-27 DIAGNOSIS — I129 Hypertensive chronic kidney disease with stage 1 through stage 4 chronic kidney disease, or unspecified chronic kidney disease: Secondary | ICD-10-CM | POA: Diagnosis not present

## 2023-12-27 DIAGNOSIS — N1330 Unspecified hydronephrosis: Secondary | ICD-10-CM | POA: Diagnosis not present

## 2023-12-27 DIAGNOSIS — N183 Chronic kidney disease, stage 3 unspecified: Secondary | ICD-10-CM | POA: Diagnosis not present

## 2023-12-27 DIAGNOSIS — R7302 Impaired glucose tolerance (oral): Secondary | ICD-10-CM | POA: Diagnosis not present

## 2023-12-27 DIAGNOSIS — E669 Obesity, unspecified: Secondary | ICD-10-CM | POA: Diagnosis not present

## 2023-12-27 LAB — COLOGUARD: COLOGUARD: NEGATIVE

## 2024-01-17 DIAGNOSIS — H25811 Combined forms of age-related cataract, right eye: Secondary | ICD-10-CM | POA: Diagnosis not present

## 2024-01-17 DIAGNOSIS — Z01818 Encounter for other preprocedural examination: Secondary | ICD-10-CM | POA: Diagnosis not present

## 2024-01-30 DIAGNOSIS — I10 Essential (primary) hypertension: Secondary | ICD-10-CM | POA: Diagnosis not present

## 2024-01-30 DIAGNOSIS — H25811 Combined forms of age-related cataract, right eye: Secondary | ICD-10-CM | POA: Diagnosis not present

## 2024-02-13 DIAGNOSIS — F419 Anxiety disorder, unspecified: Secondary | ICD-10-CM | POA: Diagnosis not present

## 2024-02-13 DIAGNOSIS — H25812 Combined forms of age-related cataract, left eye: Secondary | ICD-10-CM | POA: Diagnosis not present

## 2024-02-13 DIAGNOSIS — I10 Essential (primary) hypertension: Secondary | ICD-10-CM | POA: Diagnosis not present

## 2024-03-06 ENCOUNTER — Other Ambulatory Visit: Payer: Self-pay | Admitting: Internal Medicine

## 2024-03-06 MED ORDER — EZETIMIBE 10 MG PO TABS: 10.0000 mg | ORAL_TABLET | Freq: Every day | ORAL | 3 refills | Status: AC

## 2024-03-06 MED ORDER — TRUEPLUS LANCETS 30G MISC
3 refills | Status: AC
Start: 2024-03-06 — End: ?

## 2024-03-06 MED ORDER — TRUE METRIX BLOOD GLUCOSE TEST VI STRP: ORAL_STRIP | 3 refills | Status: AC

## 2024-03-06 NOTE — Telephone Encounter (Signed)
 Copied from CRM [id number]. Topic: Clinical - Medication Refill >> Mar 06, 2024 10:33 AM Geroge Baseman wrote: Most Recent Primary Care Visit:  Provider: Margaree Mackintosh  Department: [facility]  Visit Type: MEDICARE WELL VISIT 45  Date: 11/08/2023  Medication:  ezetimibe (ZETIA) 10 MG tablet TRUE METRIX BLOOD GLUCOSE TEST test strip  TRUEplus Lancets 30G MISC    Has the patient contacted their pharmacy? Yes Call office for refills  Is this the correct pharmacy for this prescription? Yes If no, delete pharmacy and type the correct one.  This is the patient's preferred pharmacy:  [facility] - [place], [place] - [address] [place] [address] Phone: [phone] Fax: [phone]    Has the prescription been filled recently? No  Is the patient out of the medication? No  Has the patient been seen for an appointment in the last year OR does the patient have an upcoming appointment? Yes  Can we respond through MyChart? No  Agent: Please be advised that Rx refills may take up to 3 business days. We ask that you follow-up with your pharmacy.

## 2024-03-06 NOTE — Telephone Encounter (Unsigned)
 Copied from CRM [id number]. Topic: Clinical - Medication Refill >> Mar 06, 2024 10:33 AM Geroge Baseman wrote: Most Recent Primary Care Visit:  Provider: Margaree Mackintosh  Department: [facility]  Visit Type: MEDICARE WELL VISIT 45  Date: 11/08/2023  Medication:  ezetimibe (ZETIA) 10 MG tablet TRUE METRIX BLOOD GLUCOSE TEST test strip  TRUEplus Lancets 30G MISC    Has the patient contacted their pharmacy? Yes Call office for refills  Is this the correct pharmacy for this prescription? Yes If no, delete pharmacy and type the correct one.  This is the patient's preferred pharmacy:  [facility] - [place], [place] - [address] [place] [address] Phone: [phone] Fax: [phone]    Has the prescription been filled recently? No  Is the patient out of the medication? No  Has the patient been seen for an appointment in the last year OR does the patient have an upcoming appointment? Yes  Can we respond through MyChart? No  Agent: Please be advised that Rx refills may take up to 3 business days. We ask that you follow-up with your pharmacy.

## 2024-03-06 NOTE — Addendum Note (Signed)
 Addended by: Thelma Barge D on: 03/06/2024 02:45 PM   Modules accepted: Orders

## 2024-03-07 NOTE — Telephone Encounter (Signed)
 Duplicate. Medication refill request addressed and refilled yesterday. Closing encounter.

## 2024-04-11 NOTE — Progress Notes (Signed)
 Patient Care Team: Sylvan Evener, MD as PCP - General (Internal Medicine)  Visit Date: 05/08/24  Subjective:   Chief Complaint  Patient presents with   Hyperlipidemia   Hypertension   Impaired glucose intolerance  Patient ZO:XWRU B Claros,Female DOB:10-30-1946,77 y.o. EAV:[id number]   78 y.o. Female presents today for 6 months follow-up for Hypertension; Hyperlipidemia; Impaired Glucose Tolerance. Seen for annual visit on 11/08/2023, in the interim has seen Orthopedics x2, Ophthalmology x4, Nephrology, and Mammogram 12/23/2023 normal.   History of Hypertension treated with Metoprolol  tartrate 100 mg daily. Blood Pressure: normotensive today at 120/80. Dependent Edema treated with Lisinopril -HCTZ 20-25 mg daily. Hypokalemia treated with Klor-Con  10 MEQ daily.  History of Hyperlipidemia treated with Simvastatin  10 mg daily and Zetia  10 mg daily. 05/03/2024 Lipid Panel: Cholesterol 207, decreased from 224 in 04/2023.   History of Impaired Glucose Tolerance with 05/03/2024 HgbA1c 5.8, decreased from 6.2 in 04/2023. S/p Bilateral Cataract Surgery in February & March. Says she does have occasional neuropathy in her feet, but is not constant.   Reviewed 05/03/2024 Microalbumin/Creatinine: Ratio 93, elevated from 4 in 04/2023; otherwise WNL.   History of Anemia with Hemoglobin 10.9 in November 2024. Says that she has been eating more red meat and plenty of leafy greens recently; denies melena or hematochezia.  Vaccine Counseling: Discussed Shingrix - postponed.  Cologuard 12/20/2023 negative.  Past Medical History:  Diagnosis Date   Arthritis    shoulder   Hypertension     Allergies  Allergen Reactions   Hydrocodone     REACTION: nausea   Hydrocodone-Acetaminophen  Nausea And Vomiting    Family History  Problem Relation Age of Onset   Diabetes Sister    Hypertension Sister    Hypertension Brother    Diabetes Sister    Hypertension Sister    Stroke Sister    Heart disease Brother     Social History   Social History Narrative   Not on file   Review of Systems  Gastrointestinal:  Negative for blood in stool and melena.  All other systems reviewed and are negative.    Objective:  Vitals: BP 120/80   Pulse 63   Ht 5\' 1"  (1.549 m)   Wt 204 lb (92.5 kg)   SpO2 97%   BMI 38.55 kg/m   Physical Exam Vitals and nursing note reviewed.  Constitutional:      General: She is not in acute distress.    Appearance: Normal appearance. She is not toxic-appearing.  HENT:     Head: Normocephalic and atraumatic.  Cardiovascular:     Rate and Rhythm: Normal rate and regular rhythm. No extrasystoles are present.    Pulses: Normal pulses.          Dorsalis pedis pulses are 2+ on the right side and 2+ on the left side.       Posterior tibial pulses are 2+ on the right side and 2+ on the left side.     Heart sounds: Normal heart sounds. No murmur heard.    No friction rub. No gallop.  Pulmonary:     Effort: Pulmonary effort is normal. No respiratory distress.     Breath sounds: Normal breath sounds. No wheezing or rales.  Feet:     Right foot:     Skin integrity: Skin integrity normal. No ulcer, blister, skin breakdown, erythema, warmth, callus, dry skin or fissure.     Left foot:     Skin integrity: Skin integrity normal. No ulcer, blister,  skin breakdown, erythema, warmth, callus, dry skin or fissure.  Skin:    General: Skin is warm and dry.  Neurological:     Mental Status: She is alert and oriented to person, place, and time. Mental status is at baseline.  Psychiatric:        Mood and Affect: Mood normal.        Behavior: Behavior normal.        Thought Content: Thought content normal.        Judgment: Judgment normal.     Results:  Studies Obtained And Personally Reviewed By Me:  Diabetic Foot Exam - Simple   Simple Foot Form Diabetic Foot exam was performed with the following findings: Yes 05/08/2024  9:53 AM  Visual Inspection No deformities, no  ulcerations, no other skin breakdown bilaterally: Yes Sensation Testing Intact to touch and monofilament testing bilaterally: Yes Pulse Check Posterior Tibialis and Dorsalis pulse intact bilaterally: Yes Comments    Mammogram 12/23/2023 normal.   Cologuard 12/20/2023 negative.  Labs:     Component Value Date/Time   NA 137 11/03/2023 0911   K 4.5 11/03/2023 0911   CL 102 11/03/2023 0911   CO2 24 11/03/2023 0911   GLUCOSE 92 11/03/2023 0911   BUN 21 11/03/2023 0911   CREATININE 0.96 11/03/2023 0911   CALCIUM 9.5 11/03/2023 0911   PROT 7.4 05/03/2024 0928   ALBUMIN 4.3 07/29/2017 0913   AST 17 05/03/2024 0928   ALT 12 05/03/2024 0928   ALKPHOS 109 07/29/2017 0913   BILITOT 0.4 05/03/2024 0928   GFRNONAA 52 (L) 10/20/2020 0938   GFRAA 60 10/20/2020 0938    Lab Results  Component Value Date   WBC 5.9 11/03/2023   HGB 10.9 (L) 11/03/2023   HCT 34.0 (L) 11/03/2023   MCV 86.7 11/03/2023   PLT 226 11/03/2023   Lab Results  Component Value Date   CHOL 207 (H) 05/03/2024   HDL 99 05/03/2024   LDLCALC 88 05/03/2024   TRIG 108 05/03/2024   CHOLHDL 2.1 05/03/2024   Lab Results  Component Value Date   HGBA1C 5.8 (H) 05/03/2024    Lab Results  Component Value Date   TSH 1.20 11/03/2023   Assessment & Plan:   Orders Placed This Encounter  Procedures   CBC with Differential/Platelet   Hypertension treated with Metoprolol  tartrate 100 mg daily. Blood Pressure: normotensive today at 120/80.   Dependent Edema treated with Lisinopril -HCTZ 20-25 mg daily.   Hypokalemia treated with Klor-Con  10 MEQ daily.  Hyperlipidemia treated with Simvastatin  10 mg daily and Zetia  10 mg daily. 05/03/2024 Lipid Panel: Cholesterol 207, decreased from 224 in 04/2023.   Impaired Glucose Tolerance with 05/03/2024 HgbA1c 5.8, decreased from 6.2 in 04/2023. S/p Bilateral Cataract Surgery in February & March. Says she does have occasional neuropathy in her feet, but is not constant.   Reviewed  05/03/2024 Microalbumin/Creatinine: Ratio 93, elevated from 4 in 04/2023; otherwise WNL.   History of Anemia with Hemoglobin 10.9 in November 2024. Says that she has been eating more red meat and plenty of leafy greens recently; denies melena or hematochezia. Ordering CBC.  Vaccine Counseling: Discussed Shingrix - postponed.  Cologuard 12/20/2023 negative.   Mammogram 12/23/2023 normal.   Return in 29 weeks (on 11/27/2024) for annual labs and 12/04/2024 for annual visit.    I,Sharon Russell,acting as a Neurosurgeon for Sylvan Evener, MD.,have documented all relevant documentation on the behalf of Sylvan Evener, MD,as directed by  Sylvan Evener, MD while  in the presence of Sylvan Evener, MD.   Sharon Russell

## 2024-05-03 ENCOUNTER — Other Ambulatory Visit: Payer: Medicare HMO

## 2024-05-03 DIAGNOSIS — I10 Essential (primary) hypertension: Secondary | ICD-10-CM | POA: Diagnosis not present

## 2024-05-03 DIAGNOSIS — E785 Hyperlipidemia, unspecified: Secondary | ICD-10-CM | POA: Diagnosis not present

## 2024-05-03 DIAGNOSIS — R7302 Impaired glucose tolerance (oral): Secondary | ICD-10-CM | POA: Diagnosis not present

## 2024-05-03 DIAGNOSIS — E1169 Type 2 diabetes mellitus with other specified complication: Secondary | ICD-10-CM

## 2024-05-04 LAB — HEPATIC FUNCTION PANEL
AG Ratio: 1.4 (calc) (ref 1.0–2.5)
ALT: 12 U/L (ref 6–29)
AST: 17 U/L (ref 10–35)
Albumin: 4.3 g/dL (ref 3.6–5.1)
Alkaline phosphatase (APISO): 108 U/L (ref 37–153)
Bilirubin, Direct: 0.1 mg/dL (ref 0.0–0.2)
Globulin: 3.1 g/dL (ref 1.9–3.7)
Indirect Bilirubin: 0.3 mg/dL (ref 0.2–1.2)
Total Bilirubin: 0.4 mg/dL (ref 0.2–1.2)
Total Protein: 7.4 g/dL (ref 6.1–8.1)

## 2024-05-04 LAB — MICROALBUMIN / CREATININE URINE RATIO
Creatinine, Urine: 55 mg/dL (ref 20–275)
Microalb Creat Ratio: 93 mg/g{creat} — ABNORMAL HIGH (ref <30)
Microalb, Ur: 5.1 mg/dL

## 2024-05-04 LAB — LIPID PANEL
Cholesterol: 207 mg/dL — ABNORMAL HIGH (ref <200)
HDL: 99 mg/dL (ref >=50)
LDL Cholesterol (Calc): 88 mg/dL
Non-HDL Cholesterol (Calc): 108 mg/dL (ref <130)
Total CHOL/HDL Ratio: 2.1 (calc) (ref <5.0)
Triglycerides: 108 mg/dL (ref <150)

## 2024-05-04 LAB — HEMOGLOBIN A1C
Hgb A1c MFr Bld: 5.8 % — ABNORMAL HIGH (ref <5.7)
Mean Plasma Glucose: 120 mg/dL
eAG (mmol/L): 6.6 mmol/L

## 2024-05-08 ENCOUNTER — Encounter: Payer: Self-pay | Admitting: Internal Medicine

## 2024-05-08 ENCOUNTER — Ambulatory Visit: Payer: Medicare HMO | Admitting: Internal Medicine

## 2024-05-08 VITALS — BP 120/80 | HR 63 | Ht 61.0 in | Wt 204.0 lb

## 2024-05-08 DIAGNOSIS — N1831 Chronic kidney disease, stage 3a: Secondary | ICD-10-CM

## 2024-05-08 DIAGNOSIS — R7302 Impaired glucose tolerance (oral): Secondary | ICD-10-CM

## 2024-05-08 DIAGNOSIS — M1712 Unilateral primary osteoarthritis, left knee: Secondary | ICD-10-CM

## 2024-05-08 DIAGNOSIS — I10 Essential (primary) hypertension: Secondary | ICD-10-CM

## 2024-05-08 DIAGNOSIS — E785 Hyperlipidemia, unspecified: Secondary | ICD-10-CM | POA: Diagnosis not present

## 2024-05-08 DIAGNOSIS — E876 Hypokalemia: Secondary | ICD-10-CM | POA: Diagnosis not present

## 2024-05-08 DIAGNOSIS — D649 Anemia, unspecified: Secondary | ICD-10-CM

## 2024-05-08 DIAGNOSIS — R609 Edema, unspecified: Secondary | ICD-10-CM

## 2024-05-08 DIAGNOSIS — E1169 Type 2 diabetes mellitus with other specified complication: Secondary | ICD-10-CM

## 2024-05-08 DIAGNOSIS — J4520 Mild intermittent asthma, uncomplicated: Secondary | ICD-10-CM

## 2024-05-08 DIAGNOSIS — M170 Bilateral primary osteoarthritis of knee: Secondary | ICD-10-CM

## 2024-05-08 DIAGNOSIS — H401111 Primary open-angle glaucoma, right eye, mild stage: Secondary | ICD-10-CM

## 2024-05-08 LAB — CBC WITH DIFFERENTIAL/PLATELET
Absolute Lymphocytes: 2526 {cells}/uL (ref 850–3900)
Absolute Monocytes: 687 {cells}/uL (ref 200–950)
Basophils Absolute: 57 {cells}/uL (ref 0–200)
Basophils Relative: 0.9 %
Eosinophils Absolute: 328 {cells}/uL (ref 15–500)
Eosinophils Relative: 5.2 %
HCT: 32.4 % — ABNORMAL LOW (ref 35.0–45.0)
Hemoglobin: 10.4 g/dL — ABNORMAL LOW (ref 11.7–15.5)
MCH: 27.2 pg (ref 27.0–33.0)
MCHC: 32.1 g/dL (ref 32.0–36.0)
MCV: 84.8 fL (ref 80.0–100.0)
MPV: 10.3 fL (ref 7.5–12.5)
Monocytes Relative: 10.9 %
Neutro Abs: 2703 {cells}/uL (ref 1500–7800)
Neutrophils Relative %: 42.9 %
Platelets: 299 10*3/uL (ref 140–400)
RBC: 3.82 10*6/uL (ref 3.80–5.10)
RDW: 13.4 % (ref 11.0–15.0)
Total Lymphocyte: 40.1 %
WBC: 6.3 10*3/uL (ref 3.8–10.8)

## 2024-05-09 ENCOUNTER — Ambulatory Visit: Payer: Self-pay | Admitting: Internal Medicine

## 2024-05-09 DIAGNOSIS — D649 Anemia, unspecified: Secondary | ICD-10-CM | POA: Insufficient documentation

## 2024-05-09 DIAGNOSIS — N1831 Chronic kidney disease, stage 3a: Secondary | ICD-10-CM | POA: Insufficient documentation

## 2024-06-06 DIAGNOSIS — H40063 Primary angle closure without glaucoma damage, bilateral: Secondary | ICD-10-CM | POA: Diagnosis not present

## 2024-06-08 ENCOUNTER — Inpatient Hospital Stay: Admitting: Hematology and Oncology

## 2024-06-08 ENCOUNTER — Inpatient Hospital Stay: Attending: Hematology and Oncology

## 2024-06-08 VITALS — BP 182/71 | HR 73 | Temp 98.4°F | Resp 14 | Wt 206.3 lb

## 2024-06-08 DIAGNOSIS — D649 Anemia, unspecified: Secondary | ICD-10-CM | POA: Diagnosis not present

## 2024-06-08 LAB — RETIC PANEL
Immature Retic Fract: 12.6 % (ref 2.3–15.9)
RBC.: 3.92 MIL/uL (ref 3.87–5.11)
Retic Count, Absolute: 52.5 10*3/uL (ref 19.0–186.0)
Retic Ct Pct: 1.3 % (ref 0.4–3.1)
Reticulocyte Hemoglobin: 31.5 pg (ref >27.9)

## 2024-06-08 LAB — CBC WITH DIFFERENTIAL (CANCER CENTER ONLY)
Abs Immature Granulocytes: 0.01 10*3/uL (ref 0.00–0.07)
Basophils Absolute: 0.1 10*3/uL (ref 0.0–0.1)
Basophils Relative: 1 %
Eosinophils Absolute: 0.3 10*3/uL (ref 0.0–0.5)
Eosinophils Relative: 4 %
HCT: 31.9 % — ABNORMAL LOW (ref 36.0–46.0)
Hemoglobin: 10.7 g/dL — ABNORMAL LOW (ref 12.0–15.0)
Immature Granulocytes: 0 %
Lymphocytes Relative: 32 %
Lymphs Abs: 2.5 10*3/uL (ref 0.7–4.0)
MCH: 27.6 pg (ref 26.0–34.0)
MCHC: 33.5 g/dL (ref 30.0–36.0)
MCV: 82.4 fL (ref 80.0–100.0)
Monocytes Absolute: 0.8 10*3/uL (ref 0.1–1.0)
Monocytes Relative: 11 %
Neutro Abs: 4 10*3/uL (ref 1.7–7.7)
Neutrophils Relative %: 52 %
Platelet Count: 266 10*3/uL (ref 150–400)
RBC: 3.87 MIL/uL (ref 3.87–5.11)
RDW: 14.6 % (ref 11.5–15.5)
WBC Count: 7.7 10*3/uL (ref 4.0–10.5)
nRBC: 0 % (ref 0.0–0.2)

## 2024-06-08 LAB — CMP (CANCER CENTER ONLY)
ALT: 12 U/L (ref 0–44)
AST: 15 U/L (ref 15–41)
Albumin: 4.4 g/dL (ref 3.5–5.0)
Alkaline Phosphatase: 105 U/L (ref 38–126)
Anion gap: 9 (ref 5–15)
BUN: 24 mg/dL — ABNORMAL HIGH (ref 8–23)
CO2: 23 mmol/L (ref 22–32)
Calcium: 10.1 mg/dL (ref 8.9–10.3)
Chloride: 105 mmol/L (ref 98–111)
Creatinine: 1.24 mg/dL — ABNORMAL HIGH (ref 0.44–1.00)
GFR, Estimated: 44 mL/min — ABNORMAL LOW (ref >60)
Glucose, Bld: 109 mg/dL — ABNORMAL HIGH (ref 70–99)
Potassium: 4.1 mmol/L (ref 3.5–5.1)
Sodium: 137 mmol/L (ref 135–145)
Total Bilirubin: 0.4 mg/dL (ref 0.0–1.2)
Total Protein: 7.7 g/dL (ref 6.5–8.1)

## 2024-06-08 LAB — FOLATE: Folate: 12.3 ng/mL (ref >5.9)

## 2024-06-08 LAB — LACTATE DEHYDROGENASE: LDH: 140 U/L (ref 98–192)

## 2024-06-08 LAB — VITAMIN B12: Vitamin B-12: 217 pg/mL (ref 180–914)

## 2024-06-08 LAB — IRON AND IRON BINDING CAPACITY (CC-WL,HP ONLY)
Iron: 58 ug/dL (ref 28–170)
Saturation Ratios: 17 % (ref 10.4–31.8)
TIBC: 339 ug/dL (ref 250–450)
UIBC: 281 ug/dL (ref 148–442)

## 2024-06-08 LAB — FERRITIN: Ferritin: 66 ng/mL (ref 11–307)

## 2024-06-08 NOTE — Progress Notes (Unsigned)
 [facility] [phone]   [phone]  INITIAL CONSULT NOTE  Patient Care Team: Perri Sharon PARAS, MD as PCP - General (Internal Medicine)  Hematological/Oncological History # Normocytic Anemia 05/08/2024: White blood cell 6.3, hemoglobin 10.4, MCV 84.8, platelets 299 06/08/2024: Establish care with Dr. Federico   CHIEF COMPLAINTS/PURPOSE OF CONSULTATION:  Normocytic Anemia   HISTORY OF PRESENTING ILLNESS:  Sharon Russell 78 y.o. female with medical history significant for arthritis and hypertension who presents for evaluation of chronic normocytic anemia.  On review of the previous records Ms. Lawhorne has a normocytic anemia dating back to at least September 2020.  At that time she had a hemoglobin of 10.9 with MCV of 85.6.  She always has a normocytic anemia with hemoglobin ranging from 11.6 down to 10.4.  Due to concern for this chronic normocytic anemia in the absence of leukopenia/thrombocytopenia the patient was referred to hematology for further evaluation and management.  On exam today Ms. Frosch reports that she was recently told that she had a low hemoglobin.  She reports she is not prone to bleeding or any blood in the stool.  She reports her energy is about a 7 out of 10.  She reports that she is not on any kind of special diets and eats red meat about once per month.  She prefers chicken and fish.  She reports that she does have some issues with arthritis in her shoulder and knee.  On further discussion she reports that she has 2 sisters 1 of which has a rare blood issue.  She reports that both her sisters get iron.  Another sister is a breast cancer survivor.  Her mom and father both had heart attacks.  She has 3 healthy grown sons.  She notes that she is a never smoker but drinks about 2 brandies every weekday night.  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  She notes that she has no fevers but does have some  occasional chills.  She does have some trouble with headaches but no lightheadedness or dizziness.  A full 10 point ROS is otherwise negative.  MEDICAL HISTORY:  Past Medical History:  Diagnosis Date   Arthritis    shoulder   Hypertension     SURGICAL HISTORY: Past Surgical History:  Procedure Laterality Date   ABDOMINAL HYSTERECTOMY  [id number]   GALLBLADDER SURGERY  [id number]    SOCIAL HISTORY: Social History   Socioeconomic History   Marital status: Married    Spouse name: Not on file   Number of children: Not on file   Years of education: Not on file   Highest education level: Not on file  Occupational History   Not on file  Tobacco Use   Smoking status: Never   Smokeless tobacco: Never  Substance and Sexual Activity   Alcohol use: Yes    Alcohol/week: 1.0 standard drink of alcohol    Types: 1 Glasses of wine per week   Drug use: Not on file   Sexual activity: Not on file  Other Topics Concern   Not on file  Social History Narrative   Not on file   Social Drivers of Health   Financial Resource Strain: Low Risk  (11/08/2023)   Overall Financial Resource Strain (CARDIA)    Difficulty of Paying Living Expenses: Not very hard  Food Insecurity: No Food Insecurity (06/08/2024)   Hunger Vital Sign    Worried About Running Out of Food  in the Last Year: Never true    Ran Out of Food in the Last Year: Never true  Transportation Needs: No Transportation Needs (06/08/2024)   PRAPARE - Administrator, Civil Service (Medical): No    Lack of Transportation (Non-Medical): No  Physical Activity: Insufficiently Active (11/08/2023)   Exercise Vital Sign    Days of Exercise per Week: 1 day    Minutes of Exercise per Session: 10 min  Stress: No Stress Concern Present (11/08/2023)   [employer] of Occupational Health - Occupational Stress Questionnaire    Feeling of Stress : Not at all  Social Connections: Moderately Integrated (11/08/2023)   Social Connection  and Isolation Panel    Frequency of Communication with Friends and Family: More than three times a week    Frequency of Social Gatherings with Friends and Family: Not on file    Attends Religious Services: More than 4 times per year    Active Member of [employer] or Organizations: Yes    Attends Banker Meetings: More than 4 times per year    Marital Status: Widowed  Intimate Partner Violence: Not At Risk (06/08/2024)   Humiliation, Afraid, Rape, and Kick questionnaire    Fear of Current or Ex-Partner: No    Emotionally Abused: No    Physically Abused: No    Sexually Abused: No    FAMILY HISTORY: Family History  Problem Relation Age of Onset   Diabetes Sister    Hypertension Sister    Hypertension Brother    Diabetes Sister    Hypertension Sister    Stroke Sister    Heart disease Brother     ALLERGIES:  is allergic to hydrocodone and hydrocodone-acetaminophen .  MEDICATIONS:  Current Outpatient Medications  Medication Sig Dispense Refill   albuterol  (VENTOLIN  HFA) 108 (90 Base) MCG/ACT inhaler Inhale 2 puffs into the lungs every 6 (six) hours as needed for wheezing or shortness of breath. 8 g 2   Alcohol Swabs (DROPSAFE ALCOHOL PREP) 70 % PADS USE ONE TIME DAILY TO CHECK BLOOD GLUCOSE 100 each prn   aspirin 81 MG tablet Take 81 mg by mouth daily.     Blood Glucose Calibration (ACCU-CHEK AVIVA) SOLN USE TO CHECK GLUCOSE READINGS ONCE A DAY. 1 each 0   Blood Glucose Monitoring Suppl (TRUE METRIX AIR GLUCOSE METER) DEVI Check glucose one time daily 1 each 3   Blood Glucose Monitoring Suppl (TRUE METRIX AIR GLUCOSE METER) w/Device KIT USE AS DIRECTED 1 kit 3   cholecalciferol (VITAMIN D ) 1000 UNITS tablet Take 1,000 Units by mouth daily.     diclofenac  Sodium (VOLTAREN ) 1 % GEL Apply 2 g topically 4 (four) times daily. APPLY TO LEFT KNEE 50 g 0   ezetimibe  (ZETIA ) 10 MG tablet Take 1 tablet (10 mg total) by mouth daily. 90 tablet 3   glucose blood (TRUE METRIX BLOOD GLUCOSE  TEST) test strip Use as instructed 100 strip 3   latanoprost (XALATAN) 0.005 % ophthalmic solution Place 1 drop into both eyes at bedtime.     lisinopril -hydrochlorothiazide  (ZESTORETIC ) 20-25 MG tablet TAKE 1 TABLET EVERY DAY 90 tablet 3   metoprolol  tartrate (LOPRESSOR ) 100 MG tablet TAKE 1 TABLET TWICE DAILY 180 tablet 3   potassium chloride  (KLOR-CON  M) 10 MEQ tablet TAKE 1 TABLET EVERY DAY 90 tablet 3   simvastatin  (ZOCOR ) 10 MG tablet TAKE 1 TABLET AT BEDTIME 90 tablet 3   tiZANidine  (ZANAFLEX ) 2 MG tablet Take 1 tablet (2 mg total)  by mouth every 8 (eight) hours as needed for muscle spasms. 30 tablet 0   TRUEplus Lancets 30G MISC CHECK GLUCOSE ONE TIME DAILY 100 each 3   No current facility-administered medications for this visit.    REVIEW OF SYSTEMS:   Constitutional: ( - ) fevers, ( - )  chills , ( - ) night sweats Eyes: ( - ) blurriness of vision, ( - ) double vision, ( - ) watery eyes Ears, nose, mouth, throat, and face: ( - ) mucositis, ( - ) sore throat Respiratory: ( - ) cough, ( - ) dyspnea, ( - ) wheezes Cardiovascular: ( - ) palpitation, ( - ) chest discomfort, ( - ) lower extremity swelling Gastrointestinal:  ( - ) nausea, ( - ) heartburn, ( - ) change in bowel habits Skin: ( - ) abnormal skin rashes Lymphatics: ( - ) new lymphadenopathy, ( - ) easy bruising Neurological: ( - ) numbness, ( - ) tingling, ( - ) new weaknesses Behavioral/Psych: ( - ) mood change, ( - ) new changes  All other systems were reviewed with the patient and are negative.  PHYSICAL EXAMINATION:  Vitals:   06/08/24 1344  BP: (!) 182/71  Pulse: 73  Resp: 14  Temp: 98.4 F (36.9 C)  SpO2: 99%   Filed Weights   06/08/24 1344  Weight: 206 lb 4.8 oz (93.6 kg)    GENERAL: well appearing elderly African-American female in NAD  SKIN: skin color, texture, turgor are normal, no rashes or significant lesions EYES: conjunctiva are pink and non-injected, sclera clear LUNGS: clear to auscultation  and percussion with normal breathing effort HEART: regular rate & rhythm and no murmurs and no lower extremity edema Musculoskeletal: no cyanosis of digits and no clubbing  PSYCH: alert & oriented x 3, fluent speech NEURO: no focal motor/sensory deficits  LABORATORY DATA:  I have reviewed the data as listed    Latest Ref Rng & Units 06/08/2024    3:08 PM 05/08/2024   10:04 AM 11/03/2023    9:11 AM  CBC  WBC 4.0 - 10.5 K/uL 7.7  6.3  5.9   Hemoglobin 12.0 - 15.0 g/dL 89.2  89.5  89.0   Hematocrit 36.0 - 46.0 % 31.9  32.4  34.0   Platelets 150 - 400 K/uL 266  299  226        Latest Ref Rng & Units 06/08/2024    3:08 PM 05/03/2024    9:28 AM 11/03/2023    9:11 AM  CMP  Glucose 70 - 99 mg/dL 890   92   BUN 8 - 23 mg/dL 24   21   Creatinine 9.55 - 1.00 mg/dL 8.75   9.03   Sodium 864 - 145 mmol/L 137   137   Potassium 3.5 - 5.1 mmol/L 4.1   4.5   Chloride 98 - 111 mmol/L 105   102   CO2 22 - 32 mmol/L 23   24   Calcium 8.9 - 10.3 mg/dL 89.8   9.5   Total Protein 6.5 - 8.1 g/dL 7.7  7.4  6.9   Total Bilirubin 0.0 - 1.2 mg/dL 0.4  0.4  0.4   Alkaline Phos 38 - 126 U/L 105     AST 15 - 41 U/L 15  17  16    ALT 0 - 44 U/L 12  12  13       ASSESSMENT & PLAN Sharon Russell 78 y.o. female with medical history significant for  arthritis and hypertension who presents for evaluation of chronic normocytic anemia.  After review of the labs, review of the records, and discussion with the patient the patients findings are most consistent with normocytic anemia of unclear etiology   # Normocytic Anemia -- Will order full nutritional studies to include iron panel, ferritin, vitamin B12, folate, and methylmalonic acid. -- Will order hemolysis labs with LDH and reticulocytes.  If evidence of hemolysis we will also order haptoglobin and DAT -- Rule out multiple myeloma with SPEP and serum free light chains -- If no clear etiology can be found we will need to pursue a bone marrow biopsy. -- Return  to clinic pending the results of the above studies.   Orders Placed This Encounter  Procedures   CBC with Differential (Cancer Center Only)    Standing Status:   Future    Number of Occurrences:   1    Expiration Date:   06/08/2025   CMP (Cancer Center only)    Standing Status:   Future    Number of Occurrences:   1    Expiration Date:   06/08/2025   Iron and Iron Binding Capacity ([facility] only)    Standing Status:   Future    Number of Occurrences:   1    Expiration Date:   06/08/2025   Retic Panel    Standing Status:   Future    Number of Occurrences:   1    Expiration Date:   06/08/2025   Ferritin    Standing Status:   Future    Number of Occurrences:   1    Expiration Date:   06/08/2025   Lactate dehydrogenase (LDH)    Standing Status:   Future    Number of Occurrences:   1    Expiration Date:   06/08/2025   Vitamin B12    Standing Status:   Future    Number of Occurrences:   1    Expiration Date:   06/08/2025   Methylmalonic acid, serum    Standing Status:   Future    Number of Occurrences:   1    Expiration Date:   06/08/2025   Folate, Serum    Standing Status:   Future    Number of Occurrences:   1    Expiration Date:   06/08/2025   Multiple Myeloma Panel (SPEP&IFE w/QIG)    Standing Status:   Future    Number of Occurrences:   1    Expiration Date:   06/08/2025   Kappa/lambda light chains    Standing Status:   Future    Number of Occurrences:   1    Expiration Date:   06/08/2025    All questions were answered. The patient knows to call the clinic with any problems, questions or concerns.  A total of more than 60 minutes were spent on this encounter with face-to-face time and non-face-to-face time, including preparing to see the patient, ordering tests and/or medications, counseling the patient and coordination of care as outlined above.   Norleen IVAR Kidney, MD Department of Hematology/Oncology [facility] at [facility] Phone:  [phone] Pager: [phone] Email: [email or web address]  06/10/2024 2:14 PM

## 2024-06-11 LAB — MULTIPLE MYELOMA PANEL, SERUM
Albumin SerPl Elph-Mcnc: 3.6 g/dL (ref 2.9–4.4)
Albumin/Glob SerPl: 1.1 (ref 0.7–1.7)
Alpha 1: 0.3 g/dL (ref 0.0–0.4)
Alpha2 Glob SerPl Elph-Mcnc: 1 g/dL (ref 0.4–1.0)
B-Globulin SerPl Elph-Mcnc: 1.1 g/dL (ref 0.7–1.3)
Gamma Glob SerPl Elph-Mcnc: 1.1 g/dL (ref 0.4–1.8)
Globulin, Total: 3.5 g/dL (ref 2.2–3.9)
IgA: 170 mg/dL (ref 64–422)
IgG (Immunoglobin G), Serum: 1095 mg/dL (ref 586–1602)
IgM (Immunoglobulin M), Srm: 37 mg/dL (ref 26–217)
Total Protein ELP: 7.1 g/dL (ref 6.0–8.5)

## 2024-06-11 LAB — KAPPA/LAMBDA LIGHT CHAINS
Kappa free light chain: 24.2 mg/L — ABNORMAL HIGH (ref 3.3–19.4)
Kappa, lambda light chain ratio: 1.51 (ref 0.26–1.65)
Lambda free light chains: 16 mg/L (ref 5.7–26.3)

## 2024-06-12 LAB — METHYLMALONIC ACID, SERUM: Methylmalonic Acid, Quantitative: 200 nmol/L (ref 0–378)

## 2024-06-18 ENCOUNTER — Ambulatory Visit: Payer: Self-pay | Admitting: *Deleted

## 2024-06-18 MED ORDER — VITAMIN B-12 1000 MCG PO TABS
1000.0000 ug | ORAL_TABLET | Freq: Every day | ORAL | Status: AC
Start: 2024-06-18 — End: ?

## 2024-06-18 NOTE — Telephone Encounter (Signed)
 TCT patient regarding recent lab results. Spoke with her. Advised that her blood work did not show a clear reason why her hemoglobin is low.  It is moderately low.  She does have a borderline vitamin B12 level.  At this time her options  would be:   1) take vitamin b12 1000 mcg PO daily and see us  again in 6 months    Or    2) undergo bone marrow biopsy to potentially find source of anemia.    Given that her anemia is mild Dr. Federico thinks we can continue to monitor and treat with B12 supplementation, but if she would like to pursue the bone marrow biopsy at this time it may give us  the answers as to why her blood counts are low. Pt states she prefers to try the B!2. She states she will get this on line as she has taken this before and did feel better on the B12 Scheduling message sent for labs and clinic visit in 6 months.

## 2024-06-18 NOTE — Telephone Encounter (Signed)
-----   Message from Norleen ONEIDA Kidney IV sent at 06/15/2024  6:29 PM EDT ----- Please let Ms. Pancake know that her blood work did not show a clear reason why her hemoglobin is low.  It is moderately low.  She does have a borderline vitamin B12 level.  At this time her options  would be:  1) take vitamin b12 1000 mcg PO daily and see us  again in 6 months   Or   2) undergo bone marrow biopsy to potentially find source of anemia.   Given that her anemia is mild I think we can continue to monitor and treat with B12 supplementation, but if she would like to pursue the bone marrow biopsy at this time it may give us  the answers as  to why her blood counts are low.  Please let me know what patient would like to do. ----- Message ----- From: Interface, Lab In [place] Sent: 06/08/2024   3:25 PM EDT To: Norleen ONEIDA Kidney [place], MD

## 2024-09-07 ENCOUNTER — Other Ambulatory Visit: Payer: Self-pay | Admitting: Internal Medicine

## 2024-09-25 ENCOUNTER — Ambulatory Visit (INDEPENDENT_AMBULATORY_CARE_PROVIDER_SITE_OTHER): Admitting: Internal Medicine

## 2024-09-25 ENCOUNTER — Encounter: Payer: Self-pay | Admitting: Internal Medicine

## 2024-09-25 VITALS — BP 150/80 | HR 88 | Ht 61.0 in | Wt 206.0 lb

## 2024-09-25 DIAGNOSIS — I10 Essential (primary) hypertension: Secondary | ICD-10-CM | POA: Diagnosis not present

## 2024-09-25 DIAGNOSIS — H401111 Primary open-angle glaucoma, right eye, mild stage: Secondary | ICD-10-CM | POA: Diagnosis not present

## 2024-09-25 DIAGNOSIS — R7302 Impaired glucose tolerance (oral): Secondary | ICD-10-CM

## 2024-09-25 DIAGNOSIS — J4520 Mild intermittent asthma, uncomplicated: Secondary | ICD-10-CM

## 2024-09-25 DIAGNOSIS — E7800 Pure hypercholesterolemia, unspecified: Secondary | ICD-10-CM

## 2024-09-25 DIAGNOSIS — L989 Disorder of the skin and subcutaneous tissue, unspecified: Secondary | ICD-10-CM

## 2024-09-25 MED ORDER — ALBUTEROL SULFATE HFA 108 (90 BASE) MCG/ACT IN AERS: 2.0000 | INHALATION_SPRAY | Freq: Four times a day (QID) | RESPIRATORY_TRACT | 99 refills | Status: AC | PRN

## 2024-09-25 NOTE — Patient Instructions (Addendum)
 Albuterol  inhaler refilled at patient's request for reactive airways disease.  Regarding facial lesion that developed rather suddenly recently, I am concerned that this could be perhaps a malignant lesion and she is being referred urgently to Dr. Delon Lenis, Mohs Surgeon at [facility]

## 2024-09-25 NOTE — Progress Notes (Signed)
 Patient Care Team: Perri Ronal PARAS, MD as PCP - General (Internal Medicine)  Visit Date: 09/25/24  Subjective:    Patient ID: Sharon Russell , Female   DOB: 1946-08-12, 78 y.o.    MRN: [id number]   78 y.o. Female presents today for recent development of papular skin lesion  left face. (See picture).   She has been followed here since Nov 02, 2011.  Patient has a past medical history of Hypertension, Hyperlipidemia, Impaired glucose tolerance. Hx of glaucoma. Hx of osteoarthritis of knees.  She says the lesion feels irritated. She has not used any new facial creams or products. No prior hx of these types of lesions.. She says that it appeared on her face about 6 weeks ago and that it feels hard and it doesn't bleed. It has a warty appearance and is 0.5 cm in diameter.    History of mild intermittent reactive airway disease treated with albuterol  inhaler as needed.    She recently had cataract surgery at [facility] and says that she sees much better now.  History of glaucoma both eyes and has been followed at [facility]  Uses Xalatan ophthalmic drops   Social Hx: widow resides alone. Husband passed away early 11/01/2016 with sudden MI. 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 Is now retired.  Lives independently and drives.  Family Hx: Father died of an apparent cardiac arrest with history of pacemaker and was 39 years old.  Mother died at age 44 in her sleep.  3 brothers.  One of them is still living.  1 brother died of complications of alcoholism with history of seizure disorder.  Another brother died at age 25 of an MI.  Patient had 3 sisters, one of them died of a stroke.  2 sisters living.  Mother had history of diabetes and hypertension.  1 brother and 1 sister with hypertension.  Past Medical History:  Diagnosis Date   Arthritis    shoulder   Hypertension      Family History  Problem Relation Age of  Onset   Diabetes Sister    Hypertension Sister    Hypertension Brother    Diabetes Sister    Hypertension Sister    Stroke Sister    Heart disease Brother    Social History Narrative:  She is a widow. Husband passed away in early Nov 01, 2016 with sudden MI. 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     Review of systems: Patient has no new complaints other than lesion on the left face over zygomatic arch.  See picture.       Objective:   Vitals: BP (!) 150/80   Pulse 88   Ht 5' 1 (1.549 m)   Wt 206 lb (93.4 kg)   SpO2 98%   BMI 38.92 kg/m    Physical Exam Vitals and nursing note reviewed.   See picture of new lesion left face over zygomatic arch area.  It is warty in appearance.  It is not bleeding.  Approximately 5 to 6 mm in diameter   Results:     Labs:       Component Value Date/Time   NA 137 06/08/2024 1508   K 4.1 06/08/2024 1508   CL 105 06/08/2024 1508   CO2 23 06/08/2024 1508   GLUCOSE 109 (H) 06/08/2024 1508  BUN 24 (H) 06/08/2024 1508   CREATININE 1.24 (H) 06/08/2024 1508   CREATININE 0.96 11/03/2023 0911   CALCIUM 10.1 06/08/2024 1508   PROT 7.7 06/08/2024 1508   ALBUMIN 4.4 06/08/2024 1508   AST 15 06/08/2024 1508   ALT 12 06/08/2024 1508   ALKPHOS 105 06/08/2024 1508   BILITOT 0.4 06/08/2024 1508   GFRNONAA 44 (L) 06/08/2024 1508   GFRNONAA 52 (L) 10/20/2020 0938   GFRAA 60 10/20/2020 0938     Lab Results  Component Value Date   WBC 7.7 06/08/2024   HGB 10.7 (L) 06/08/2024   HCT 31.9 (L) 06/08/2024   MCV 82.4 06/08/2024   PLT 266 06/08/2024    Lab Results  Component Value Date   CHOL 207 (H) 05/03/2024   HDL 99 05/03/2024   LDLCALC 88 05/03/2024   TRIG 108 05/03/2024   CHOLHDL 2.1 05/03/2024    Lab Results  Component Value Date   HGBA1C 5.8 (H) 05/03/2024     Lab Results  Component Value Date   TSH 1.20 11/03/2023          Assessment &  Plan:   Facial Skin Lesion: She reports that  a lesion on the left side of her face that developed rather rapidly.  No known associated trauma with this lesion or pimple.  She says that it appeared on her face rather suddenly about 6 weeks ago and that it feels hard and it doesn't bleed. It has a warty appearance and is 0.5 cm in diameter.     Referred to  Dr. Delon Lenis at [facility].  Because it is a facial lesion, it may be be necessary to remove it / treat it using Mohs surgery.  Mild intermittent reactive airway disease: treated with albuterol  inhaler as needed.     Albuterol  inhaler refilled at patient request  History of glaucoma left open-angle both eyes  Recent cataract extractions  Essential hypertension treated with Zestoretic  and metoprolol   Hyperlipidemia treated with Zetia  and low-dose simvastatin   Osteoarthritis of both knees  Impaired glucose tolerance treated with diet and exercise  Reactive airways disease/asthma treated with as needed albuterol  inhaler.  Albuterol  inhaler refilled per patient request  Plan: Referred to [facility]- Dr. Lenis for evaluation of this lesion.  See picture above.     I,Makayla C Reid,acting as a scribe for Ronal JINNY Hailstone, MD.,have documented all relevant documentation on the behalf of Ronal JINNY Hailstone, MD,as directed by  Ronal JINNY Hailstone, MD while in the presence of Ronal JINNY Hailstone, MD.   I, Ronal JINNY Hailstone, MD, have reviewed all documentation for this visit. The documentation on 09/25/2024 for the exam, diagnosis, procedures, and orders are all accurate and complete.

## 2024-10-08 ENCOUNTER — Encounter: Payer: Self-pay | Admitting: Physician Assistant

## 2024-10-08 ENCOUNTER — Ambulatory Visit: Admitting: Physician Assistant

## 2024-10-08 VITALS — BP 140/91

## 2024-10-08 DIAGNOSIS — L821 Other seborrheic keratosis: Secondary | ICD-10-CM | POA: Diagnosis not present

## 2024-10-08 DIAGNOSIS — D485 Neoplasm of uncertain behavior of skin: Secondary | ICD-10-CM | POA: Diagnosis not present

## 2024-10-08 DIAGNOSIS — L820 Inflamed seborrheic keratosis: Secondary | ICD-10-CM | POA: Diagnosis not present

## 2024-10-08 NOTE — Patient Instructions (Signed)

## 2024-10-08 NOTE — Progress Notes (Signed)
   New Patient Visit   Subjective  Sharon Russell is a 78 y.o. female NEW PATIENT who presents for the following: Mole of left cheek that has been there for about 8 weeks and it itches a lot.    The following portions of the chart were reviewed this encounter and updated as appropriate: medications, allergies, medical history  Review of Systems:  No other skin or systemic complaints except as noted in HPI or Assessment and Plan.  Objective  Well appearing patient in no apparent distress; mood and affect are within normal limits.   A focused examination was performed of the following areas: Face  Relevant exam findings are noted in the Assessment and Plan.  Left Malar Cheek 0.9 cm verrucous papule   Assessment & Plan     NEOPLASM OF UNCERTAIN BEHAVIOR OF SKIN Left Malar Cheek Epidermal / dermal shaving  Lesion diameter (cm):  0.9 Informed consent: discussed and consent obtained   Timeout: patient name, date of birth, surgical site, and procedure verified   Procedure prep:  Patient was prepped and draped in usual sterile fashion Prep type:  Isopropyl alcohol Anesthesia: the lesion was anesthetized in a standard fashion   Anesthetic:  1% lidocaine  w/ epinephrine 1-100,000 buffered w/ 8.4% NaHCO3 Instrument used: flexible razor blade   Hemostasis achieved with: pressure, aluminum chloride and electrodesiccation   Outcome: patient tolerated procedure well   Post-procedure details: sterile dressing applied and wound care instructions given   Dressing type: bandage and petrolatum    Specimen 1 - Surgical pathology Differential Diagnosis: ISK vs SCC vs other  Check Margins: No  Return for pending biopsy results.  I, Roseline Hutchinson, CMA, am acting as scribe for Adalea Handler K, PA-C .   Documentation: I have reviewed the above documentation for accuracy and completeness, and I agree with the above.  Haroldine Redler K, PA-C

## 2024-10-10 LAB — SURGICAL PATHOLOGY

## 2024-10-12 ENCOUNTER — Telehealth: Payer: Self-pay | Admitting: Orthopaedic Surgery

## 2024-10-12 ENCOUNTER — Other Ambulatory Visit: Payer: Self-pay | Admitting: Radiology

## 2024-10-12 DIAGNOSIS — M1712 Unilateral primary osteoarthritis, left knee: Secondary | ICD-10-CM

## 2024-10-12 DIAGNOSIS — M1711 Unilateral primary osteoarthritis, right knee: Secondary | ICD-10-CM

## 2024-10-12 NOTE — Telephone Encounter (Signed)
 Pt called stating that she wants a gel injection in both her knees. She hasn't been seen in over a year. Pt call back number is [phone]

## 2024-10-15 ENCOUNTER — Ambulatory Visit: Payer: Self-pay | Admitting: Physician Assistant

## 2024-10-26 ENCOUNTER — Telehealth: Payer: Self-pay | Admitting: Orthopaedic Surgery

## 2024-10-26 NOTE — Telephone Encounter (Signed)
 Patient called and said that she has been waiting 2 wks for a call back for the gel injection whether she got approved or not. CB#[phone]

## 2024-10-26 NOTE — Telephone Encounter (Signed)
 Talked with patient and appt.has been scheduled for gel.

## 2024-11-05 ENCOUNTER — Encounter: Payer: Self-pay | Admitting: Orthopaedic Surgery

## 2024-11-05 ENCOUNTER — Ambulatory Visit (INDEPENDENT_AMBULATORY_CARE_PROVIDER_SITE_OTHER): Admitting: Orthopaedic Surgery

## 2024-11-05 DIAGNOSIS — M170 Bilateral primary osteoarthritis of knee: Secondary | ICD-10-CM | POA: Diagnosis not present

## 2024-11-05 DIAGNOSIS — M1712 Unilateral primary osteoarthritis, left knee: Secondary | ICD-10-CM

## 2024-11-05 DIAGNOSIS — M1711 Unilateral primary osteoarthritis, right knee: Secondary | ICD-10-CM

## 2024-11-05 MED ORDER — HYALURONAN 88 MG/4ML IX SOSY
88.0000 mg | PREFILLED_SYRINGE | INTRA_ARTICULAR | Status: AC | PRN
  Administered 2024-11-05: 88 mg via INTRA_ARTICULAR

## 2024-11-05 NOTE — Progress Notes (Signed)
   Procedure Note  Patient: [name]             Date of Birth: September 21, 1946           MRN: [id number]             Visit Date: 11/05/2024  Procedures: Visit Diagnoses:  1. Unilateral primary osteoarthritis, left knee   2. Unilateral primary osteoarthritis, right knee     Large Joint Inj: R knee on 11/05/2024 9:53 AM Indications: diagnostic evaluation and pain Details: 22 G 1.5 in needle, superolateral approach  Arthrogram: No  Medications: 88 mg Hyaluronan 88 MG/4ML Outcome: tolerated well, no immediate complications Procedure, treatment alternatives, risks and benefits explained, specific risks discussed. Consent was given by the patient. Immediately prior to procedure a time out was called to verify the correct patient, procedure, equipment, support staff and site/side marked as required. Patient was prepped and draped in the usual sterile fashion.    Large Joint Inj: L knee on 11/05/2024 9:53 AM Indications: diagnostic evaluation and pain Details: 22 G 1.5 in needle, superolateral approach  Arthrogram: No  Medications: 88 mg Hyaluronan 88 MG/4ML Outcome: tolerated well, no immediate complications Procedure, treatment alternatives, risks and benefits explained, specific risks discussed. Consent was given by the patient. Immediately prior to procedure a time out was called to verify the correct patient, procedure, equipment, support staff and site/side marked as required. Patient was prepped and draped in the usual sterile fashion.    The patient comes in for scheduled hyaluronic acid injections in both knees to treat the pain from osteoarthritis.  She is 78 years old.  She has had these in the past but has been a very long period of time.  She says they continue to last her long enough to not consider knee replacement surgery.  She has had no acute change in her medical status.  She is obese but has no significant comorbidities otherwise from what I can tell.  Monovisc was  placed in both knees today without difficulty to treat the pain from osteoarthritis.  All question concerns were answered and addressed.  She did tolerate the hyaluronic acid injections well.  She knows to wait least 6 months mentally between these types of injections.  Lot #[id number]

## 2024-11-20 NOTE — Progress Notes (Signed)
 "  Annual Wellness Visit   Patient Care Team: Kyandre Okray, Sharon PARAS, MD as PCP - General (Internal Medicine)  Visit Date: 12/04/2024   Chief Complaint  Patient presents with   Annual Exam   Medicare Wellness   Subjective:  Patient: Sharon Russell, Female DOB: [birth year], 78 y.o. MRN: [id number] Vitals:   12/04/24 1353  BP: 120/78   Sharon Russell is a 78 y.o. Female who presents today for her Annual Wellness Visit. 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  History of Chronic kidney disease, Stage IIIa. 11/27/2024 BUN 36, Creatinine 1.37, eGFR 39, BUN/Creatinine ratio 26.    History of impaired glucose tolerance.  11/27/2024 HgbA1c 5.7%.    History of normocytic anemia; 11/27/2024 Hemoglobin 10.6, HCT 33.9, MCH 26.9, MCHC 31.3   Had COVID-19 in September 2021.  Recovered at home and did well.    History of abdominal hysterectomy without oophorectomy 1990 for fibroids.  Cholecystectomy 2009.     History of chronic left open-angle glaucoma, moderate stage and glaucoma mild stage of right eye followed by Dr. Kristie at [facility].  She uses Xalatan eyedrops in both eyes.   History of E. coli UTI in 2019.   History of osteoarthritis of her knees and is seeing orthopedics for knee injections.     History of hypertension treated with metoprolol  tartrate 100 mg twice daily. Blood pressure normal today at 120/78.   History of hyperlipidemia treated with simvastatin  10 mg daily, ezetimibe  10 mg daily. 11/27/2024 Lipid panel normal.    History of asthma  treated with albuterol  inhaler as needed.  Stage 3b CKD based on est GFR. Creatinine fasting is 1.37 was was 1.25 in June when was nonfasting    Labs 11/27/2024 Microalbumin creatinine ratio 32,  Hemoglobin 10.6, HCT 33.9, MCH 26.9, MCHC 31.3, BUN 36, Creatinine 1.37, eGFR 39, BUN/Creatinine ratio 26, HgbA1c 5.7%, Otherwise WNL.   12/23/2023 Mammogram No mammographic evidence of malignancy. Repeat in one year.    09/11/2009 Colonoscopy No polyps or cancers, internal hemorrhoids. Repeat in 10 years.    Health maintenance: Eye exam due.    Vaccine counseling: Influenza vaccine received today.   Health Maintenance  Topic Date Due   Zoster Vaccines- Shingrix (1 of 2) 06/19/1963   Influenza Vaccine  07/13/2024   COVID-19 Vaccine (5 - 2025-26 season) 08/13/2024   OPHTHALMOLOGY EXAM  09/15/2024   Medicare Annual Wellness (AWV)  11/07/2024   Mammogram  12/22/2024   FOOT EXAM  05/08/2025   HEMOGLOBIN A1C  05/28/2025   Diabetic kidney evaluation - eGFR measurement  11/27/2025   Diabetic kidney evaluation - Urine ACR  11/27/2025   DTaP/Tdap/Td (3 - Td or Tdap) 10/07/2032   Pneumococcal Vaccine: 50+ Years  Completed   Bone Density Scan  Completed   Meningococcal B Vaccine  Aged Out   Colonoscopy  Discontinued   Review of Systems  Constitutional:  Negative for fever and malaise/fatigue.  HENT:  Negative for congestion.   Eyes:  Negative for blurred vision.  Respiratory:  Negative for cough and shortness of breath.   Cardiovascular:  Negative for chest pain, palpitations and leg swelling.  Gastrointestinal:  Negative for vomiting.  Musculoskeletal:  Negative for back pain.  Skin:  Negative for rash.  Neurological:  Negative for loss of consciousness and headaches.   Objective:  Vitals: body mass index is 38.55 kg/m. Today's Vitals   12/04/24 1353  BP: 120/78  Pulse: 84  SpO2: 97%  Weight: 204 lb (92.5 kg)  Height: 5' 1 (1.549 m)  PainSc: 0-No pain   Physical Exam Vitals and  nursing note reviewed. Exam conducted with a chaperone present (Araceli Rosebud, CMA).  Constitutional:      General: She is not in acute distress.    Appearance: Normal appearance. She is not ill-appearing or toxic-appearing.  HENT:     Head: Normocephalic and atraumatic.     Right Ear: Hearing, tympanic membrane, ear canal and external ear normal.     Left Ear: Hearing, tympanic membrane, ear canal and external ear normal.     Mouth/Throat:     Pharynx: Oropharynx is clear.  Eyes:     Extraocular Movements: Extraocular movements intact.     Pupils: Pupils are equal, round, and reactive to light.  Neck:     Thyroid : No thyroid  mass, thyromegaly or thyroid  tenderness.     Vascular: No carotid bruit.  Cardiovascular:     Rate and Rhythm: Normal rate and regular rhythm. No extrasystoles are present.    Pulses:          Dorsalis pedis pulses are 2+ on the right side and 2+ on the left side.     Heart sounds: Normal heart sounds. No murmur heard.    No friction rub. No gallop.  Pulmonary:     Effort: Pulmonary effort is normal.     Breath sounds: Normal breath sounds. No decreased breath sounds, wheezing, rhonchi or rales.  Chest:     Chest wall: No mass.  Abdominal:     Palpations: Abdomen is soft. There is no hepatomegaly, splenomegaly or mass.     Tenderness: There is no abdominal tenderness.     Hernia: No hernia is present.  Musculoskeletal:     Cervical back: Normal range of motion.     Right lower leg: No edema.     Left lower leg: No edema.  Lymphadenopathy:     Cervical: No cervical adenopathy.     Upper Body:     Right upper body: No supraclavicular adenopathy.     Left upper body: No supraclavicular adenopathy.  Skin:    General: Skin is warm and dry.  Neurological:     General: No focal deficit present.     Mental Status: She is alert and oriented to person, place, and time. Mental status is at baseline.     Sensory: Sensation is intact.     Motor: Motor  function is intact. No weakness.     Deep Tendon Reflexes: Reflexes are normal and symmetric.  Psychiatric:        Attention and Perception: Attention normal.        Mood and Affect: Mood normal.        Speech: Speech normal.        Behavior: Behavior normal.        Thought Content: Thought content normal.        Cognition and Memory: Cognition normal.        Judgment: Judgment normal.     Current Outpatient Medications  Medication Instructions   albuterol  (VENTOLIN  HFA) 108 (90 Base) MCG/ACT inhaler 2 puffs, Inhalation, Every 6 hours PRN   Alcohol Swabs (DROPSAFE ALCOHOL  PREP) 70 % PADS USE ONE TIME DAILY TO CHECK BLOOD GLUCOSE   aspirin 81 mg, Daily   Blood Glucose Calibration (ACCU-CHEK AVIVA) SOLN USE TO CHECK GLUCOSE READINGS ONCE A DAY.   Blood Glucose Monitoring Suppl (TRUE METRIX AIR GLUCOSE METER) DEVI Check glucose one time daily   Blood Glucose Monitoring Suppl (TRUE METRIX AIR GLUCOSE METER) w/Device KIT See admin instructions   cholecalciferol (VITAMIN D ) 1,000 Units, Daily   cyanocobalamin  (VITAMIN B12) 1,000 mcg, Oral, Daily   diclofenac  Sodium (VOLTAREN ) 2 g, Topical, 4 times daily, APPLY TO LEFT KNEE   ezetimibe  (ZETIA ) 10 mg, Oral, Daily   glucose blood (TRUE METRIX BLOOD GLUCOSE TEST) test strip Use as instructed   latanoprost (XALATAN) 0.005 % ophthalmic solution 1 drop, Daily at bedtime   lisinopril -hydrochlorothiazide  (ZESTORETIC ) 20-25 MG tablet 1 tablet, Oral, Daily   metoprolol  tartrate (LOPRESSOR ) 100 mg, Oral, 2 times daily   potassium chloride  (KLOR-CON  M) 10 MEQ tablet 10 mEq, Oral, Daily   simvastatin  (ZOCOR ) 10 mg, Oral, Daily at bedtime   tiZANidine  (ZANAFLEX ) 2 mg, Oral, Every 8 hours PRN   TRUEplus Lancets 30G MISC CHECK GLUCOSE ONE TIME DAILY   Past Medical History:  Diagnosis Date   Arthritis    shoulder   Hypertension    Medical/Surgical History Narrative:  Allergic/Intolerant to:  Allergies  Allergen Reactions   Hydrocodone      REACTION: nausea   Hydrocodone-Acetaminophen  Nausea And Vomiting    Past Surgical History:  Procedure Laterality Date   ABDOMINAL HYSTERECTOMY  [id number]   GALLBLADDER SURGERY  [id number]   Family History  Problem Relation Age of Onset   Diabetes Sister    Hypertension Sister    Hypertension Brother    Diabetes Sister    Hypertension Sister    Stroke Sister    Heart disease Brother    Family History Narrative: Family history: Father died of an apparent cardiac arrest with history of pacemaker and was 60 years old. Mother died at age 46 in her sleep. 3 brothers. One of them is living. 1 brother died of complications of alcoholism with history of seizure disorder. Another brother died at age 18 of an MI. Patient had 3 sisters one of them died of a stroke. 2 sisters living. Mother had history of diabetes and hypertension. 1 brother and 1 sister with hypertension.   Social history: She is a widow. Husband passed away in early 01-06-2016 with sudden MI. 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   Most Recent Health Risks Assessment:   Most Recent Social Determinants of Health (Including Hx of Tobacco, Alcohol, and Drug Use) SDOH Screenings   Food Insecurity: No Food Insecurity (06/08/2024)  Housing: Unknown (06/08/2024)  Transportation Needs: No Transportation Needs (06/08/2024)  Utilities: Not At Risk (11/08/2023)  Alcohol Screen: Low Risk (11/08/2023)  Depression (PHQ2-9): Low Risk (06/08/2024)  Financial Resource Strain: Low Risk (11/08/2023)  Physical Activity: Insufficiently Active (11/08/2023)  Social Connections: Moderately Integrated (11/08/2023)  Stress: No Stress Concern Present (11/08/2023)  Tobacco Use: Low Risk (12/04/2024)  Health Literacy: Adequate Health Literacy (11/08/2023)   Social History   Tobacco Use   Smoking status: Never   Smokeless tobacco: Never  Substance Use Topics   Alcohol use:  Yes    Alcohol/week: 1.0 standard drink of alcohol    Types: 1 Glasses of wine per week     Most Recent Fall Risk Assessment:  12/04/2024    1:58 PM  Fall Risk   Falls in the past year? 0  Number falls in past yr: 0  Injury with Fall? 0  Risk for fall due to : No Fall Risks  Follow up Falls prevention discussed;Education provided;Falls evaluation completed   Most Recent Anxiety/Depression Screenings:    06/08/2024    1:54 PM 11/08/2023   10:07 AM  PHQ 2/9 Scores  PHQ - 2 Score 0 0    Most Recent Cognitive Screening:    11/08/2023   10:09 AM  6CIT Screen  What Year? 0 points  What month? 0 points  What time? 0 points  Count back from 20 0 points  Months in reverse 0 points  Repeat phrase 0 points  Total Score 0 points    Results:  Studies Obtained And Personally Reviewed By Me:  12/23/2023 Mammogram No mammographic evidence of malignancy. Repeat in one year.    09/11/2009 Colonoscopy No polyps or cancers, internal hemorrhoids. Repeat in 10 years.    Labs:  CBC w/ Differential Lab Results  Component Value Date   WBC 6.9 11/27/2024   RBC 3.94 11/27/2024   HGB 10.6 (L) 11/27/2024   HCT 33.9 (L) 11/27/2024   PLT 245 11/27/2024   MCV 86.0 11/27/2024   MCH 26.9 (L) 11/27/2024   MCHC 31.3 (L) 11/27/2024   RDW 13.1 11/27/2024   MPV 10.5 11/27/2024   LYMPHSABS 2.5 06/08/2024   MONOABS 0.8 06/08/2024   BASOSABS 48 11/27/2024    Comprehensive Metabolic Panel Lab Results  Component Value Date   NA 138 11/27/2024   K 4.8 11/27/2024   CL 105 11/27/2024   CO2 24 11/27/2024   GLUCOSE 98 11/27/2024   BUN 36 (H) 11/27/2024   CREATININE 1.37 (H) 11/27/2024   CALCIUM 9.9 11/27/2024   PROT 7.4 11/27/2024   ALBUMIN 4.4 06/08/2024   AST 16 11/27/2024   ALT 11 11/27/2024   ALKPHOS 105 06/08/2024   BILITOT 0.5 11/27/2024   EGFR 39 (L) 11/27/2024   GFRNONAA 44 (L) 06/08/2024   Lipid Panel  Lab Results  Component Value Date   CHOL 188 11/27/2024   HDL 98  11/27/2024   LDLCALC 71 11/27/2024   TRIG 104 11/27/2024   A1c Lab Results  Component Value Date   HGBA1C 5.7 (H) 11/27/2024    TSH Lab Results  Component Value Date   TSH 0.66 11/27/2024    Assessment & Plan:   Chronic kidney disease, Stage IIIa: 11/27/2024 BUN 36, Creatinine 1.37, eGFR 39, BUN/Creatinine ratio 26.    BMET in three weeks.   Advised to drink three glasses of water prior to lab visit.    Impaired glucose tolerance:  11/27/2024 HgbA1c 5.7%.    Normocytic anemia: 11/27/2024 Hemoglobin 10.6, HCT 33.9, MCH 26.9, MCHC 31.3   Chronic left open-angle glaucoma: moderate stage and glaucoma mild stage of right eye followed by Dr. Kristie at [facility].  She uses Xalatan eyedrops in both eyes.   Osteoarthritis: of her knees and is seeing orthopedics for knee injections.     Hypertension: treated with metoprolol  tartrate 100 mg twice daily. Blood pressure normal today at 120/78.   Hyperlipidemia: treated with simvastatin  10 mg daily, ezetimibe  10 mg daily. 11/27/2024 Lipid panel normal.    Asthma: treated with albuterol  inhaler as needed.   12/23/2023 Mammogram No mammographic evidence of malignancy. Repeat in one year.    09/11/2009 Colonoscopy No polyps or cancers, internal hemorrhoids. Repeat in  10 years.    Health maintenance: Eye exam due.    Vaccine counseling: Influenza vaccine received today.      Annual Wellness Visit done today including the all of the following: Reviewed patient's Family Medical History Reviewed patient's SDOH and reviewed tobacco, alcohol, and drug use.  Reviewed and updated list of patient's medical providers Assessment of cognitive impairment was done Assessed patient's functional ability Established a written schedule for health screening services Health Risk Assessent Completed and Reviewed  Discussed health benefits of physical activity, and encouraged her to engage in regular exercise appropriate for her  age and condition.    I,Makayla C Reid,acting as a scribe for Sharon JINNY Hailstone, MD.,have documented all relevant documentation on the behalf of Sharon JINNY Hailstone, MD,as directed by  Sharon JINNY Hailstone, MD while in the presence of Sharon JINNY Hailstone, MD.  I, Sharon JINNY Hailstone, MD, have reviewed all documentation for and agree with the above Annual Wellness Visit documentation.  Sharon JINNY Hailstone, MD Internal Medicine 12/04/2024 "

## 2024-11-27 ENCOUNTER — Other Ambulatory Visit: Payer: Self-pay

## 2024-11-27 DIAGNOSIS — E7800 Pure hypercholesterolemia, unspecified: Secondary | ICD-10-CM

## 2024-11-27 DIAGNOSIS — Z0000 Encounter for general adult medical examination without abnormal findings: Secondary | ICD-10-CM

## 2024-11-27 DIAGNOSIS — I10 Essential (primary) hypertension: Secondary | ICD-10-CM

## 2024-11-27 DIAGNOSIS — E1169 Type 2 diabetes mellitus with other specified complication: Secondary | ICD-10-CM

## 2024-11-27 DIAGNOSIS — M1712 Unilateral primary osteoarthritis, left knee: Secondary | ICD-10-CM

## 2024-11-27 DIAGNOSIS — N1831 Chronic kidney disease, stage 3a: Secondary | ICD-10-CM

## 2024-11-28 LAB — TSH: TSH: 0.66 m[IU]/L (ref 0.40–4.50)

## 2024-11-28 LAB — COMPREHENSIVE METABOLIC PANEL WITH GFR
AG Ratio: 1.5 (calc) (ref 1.0–2.5)
ALT: 11 U/L (ref 6–29)
AST: 16 U/L (ref 10–35)
Albumin: 4.4 g/dL (ref 3.6–5.1)
Alkaline phosphatase (APISO): 107 U/L (ref 37–153)
BUN/Creatinine Ratio: 26 (calc) — ABNORMAL HIGH (ref 6–22)
BUN: 36 mg/dL — ABNORMAL HIGH (ref 7–25)
CO2: 24 mmol/L (ref 20–32)
Calcium: 9.9 mg/dL (ref 8.6–10.4)
Chloride: 105 mmol/L (ref 98–110)
Creat: 1.37 mg/dL — ABNORMAL HIGH (ref 0.60–0.95)
Globulin: 3 g/dL (ref 1.9–3.7)
Glucose, Bld: 98 mg/dL (ref 65–99)
Potassium: 4.8 mmol/L (ref 3.5–5.3)
Sodium: 138 mmol/L (ref 135–146)
Total Bilirubin: 0.5 mg/dL (ref 0.2–1.2)
Total Protein: 7.4 g/dL (ref 6.1–8.1)
eGFR: 39 mL/min/1.73m2 — ABNORMAL LOW (ref >=60)

## 2024-11-28 LAB — CBC WITH DIFFERENTIAL/PLATELET
Absolute Lymphocytes: 2884 {cells}/uL (ref 850–3900)
Absolute Monocytes: 600 {cells}/uL (ref 200–950)
Basophils Absolute: 48 {cells}/uL (ref 0–200)
Basophils Relative: 0.7 %
Eosinophils Absolute: 242 {cells}/uL (ref 15–500)
Eosinophils Relative: 3.5 %
HCT: 33.9 % — ABNORMAL LOW (ref 35.9–46.0)
Hemoglobin: 10.6 g/dL — ABNORMAL LOW (ref 11.7–15.5)
MCH: 26.9 pg — ABNORMAL LOW (ref 27.0–33.0)
MCHC: 31.3 g/dL — ABNORMAL LOW (ref 31.6–35.4)
MCV: 86 fL (ref 81.4–101.7)
MPV: 10.5 fL (ref 7.5–12.5)
Monocytes Relative: 8.7 %
Neutro Abs: 3126 {cells}/uL (ref 1500–7800)
Neutrophils Relative %: 45.3 %
Platelets: 245 Thousand/uL (ref 140–400)
RBC: 3.94 Million/uL (ref 3.80–5.10)
RDW: 13.1 % (ref 11.0–15.0)
Total Lymphocyte: 41.8 %
WBC: 6.9 Thousand/uL (ref 3.8–10.8)

## 2024-11-28 LAB — MICROALBUMIN / CREATININE URINE RATIO
Creatinine, Urine: 72 mg/dL (ref 20–275)
Microalb Creat Ratio: 32 mg/g{creat} — ABNORMAL HIGH (ref <30)
Microalb, Ur: 2.3 mg/dL

## 2024-11-28 LAB — LIPID PANEL
Cholesterol: 188 mg/dL (ref <200)
HDL: 98 mg/dL (ref >=50)
LDL Cholesterol (Calc): 71 mg/dL
Non-HDL Cholesterol (Calc): 90 mg/dL (ref <130)
Total CHOL/HDL Ratio: 1.9 (calc) (ref <5.0)
Triglycerides: 104 mg/dL (ref <150)

## 2024-11-28 LAB — HEMOGLOBIN A1C
Hgb A1c MFr Bld: 5.7 % — ABNORMAL HIGH (ref <5.7)
Mean Plasma Glucose: 117 mg/dL
eAG (mmol/L): 6.5 mmol/L

## 2024-12-04 ENCOUNTER — Ambulatory Visit: Admitting: Internal Medicine

## 2024-12-04 ENCOUNTER — Encounter: Payer: Self-pay | Admitting: Internal Medicine

## 2024-12-04 ENCOUNTER — Ambulatory Visit: Payer: Self-pay | Admitting: Internal Medicine

## 2024-12-04 VITALS — BP 120/78 | HR 84 | Ht 61.0 in | Wt 204.0 lb

## 2024-12-04 DIAGNOSIS — Z23 Encounter for immunization: Secondary | ICD-10-CM | POA: Diagnosis not present

## 2024-12-04 DIAGNOSIS — Z0000 Encounter for general adult medical examination without abnormal findings: Secondary | ICD-10-CM | POA: Diagnosis not present

## 2024-12-04 DIAGNOSIS — R7302 Impaired glucose tolerance (oral): Secondary | ICD-10-CM | POA: Diagnosis not present

## 2024-12-04 DIAGNOSIS — D649 Anemia, unspecified: Secondary | ICD-10-CM

## 2024-12-04 DIAGNOSIS — J4520 Mild intermittent asthma, uncomplicated: Secondary | ICD-10-CM | POA: Diagnosis not present

## 2024-12-04 DIAGNOSIS — I10 Essential (primary) hypertension: Secondary | ICD-10-CM | POA: Diagnosis not present

## 2024-12-04 DIAGNOSIS — E1169 Type 2 diabetes mellitus with other specified complication: Secondary | ICD-10-CM

## 2024-12-04 DIAGNOSIS — N1831 Chronic kidney disease, stage 3a: Secondary | ICD-10-CM

## 2024-12-04 DIAGNOSIS — H401111 Primary open-angle glaucoma, right eye, mild stage: Secondary | ICD-10-CM

## 2024-12-04 DIAGNOSIS — M170 Bilateral primary osteoarthritis of knee: Secondary | ICD-10-CM

## 2024-12-04 DIAGNOSIS — E785 Hyperlipidemia, unspecified: Secondary | ICD-10-CM

## 2024-12-04 DIAGNOSIS — E7800 Pure hypercholesterolemia, unspecified: Secondary | ICD-10-CM

## 2024-12-04 DIAGNOSIS — N1832 Chronic kidney disease, stage 3b: Secondary | ICD-10-CM

## 2024-12-04 NOTE — Progress Notes (Signed)
 "  Chief Complaint  Patient presents with   Annual Exam   Medicare Wellness     Subjective:   Sharon Russell is a 78 y.o. female who presents for a Medicare Annual Wellness Visit.  Visit info / Clinical Intake: Medicare Wellness Visit Type:: Subsequent Annual Wellness Visit Persons participating in visit and providing information:: patient Medicare Wellness Visit Mode:: In-person (required for WTM) Interpreter Needed?: No Pre-visit prep was completed: yes AWV questionnaire completed by patient prior to visit?: no Living arrangements:: with family/others Patient's Overall Health Status Rating: good Typical amount of pain: some Does pain affect daily life?: (!) yes Are you currently prescribed opioids?: no  Dietary Habits and Nutritional Risks How many meals a day?: 3 Eats fruit and vegetables daily?: yes Most meals are obtained by: preparing own meals In the last 2 weeks, have you had any of the following?: none Diabetic:: no  Functional Status Activities of Daily Living (to include ambulation/medication): (!) Needs Assist Feeding: Independent Dressing/Grooming: Independent Bathing: Independent Toileting: Independent Transfer: Independent Ambulation: Independent with device- listed below Home Assistive Devices/Equipment: Cane Medication Administration: Independent Home Management (perform basic housework or laundry): Independent Manage your own finances?: yes Primary transportation is: driving Concerns about vision?: no *vision screening is required for WTM* Concerns about hearing?: no  Fall Screening Falls in the past year?: 0 Number of falls in past year: 0 Was there an injury with Fall?: 0 Fall Risk Category Calculator: 0 Patient Fall Risk Level: Low Fall Risk  Fall Risk Patient at Risk for Falls Due to: No Fall Risks Fall risk Follow up: Falls prevention discussed; Education provided; Falls evaluation completed  Home and Transportation Safety: All rugs have  non-skid backing?: (!) no All stairs or steps have railings?: (!) no Grab bars in the bathtub or shower?: yes Have non-skid surface in bathtub or shower?: yes Good home lighting?: yes Regular seat belt use?: yes Russell stays in the last year:: no  Cognitive Assessment Difficulty concentrating, remembering, or making decisions? : no Will 6CIT or Mini Cog be Completed: no 6CIT or Mini Cog Declined: patient alert, oriented, able to answer questions appropriately and recall recent events  Advance Directives (For Healthcare) Does Patient Have a Medical Advance Directive?: Yes Does patient want to make changes to medical advance directive?: No - Patient declined Type of Advance Directive: Living will; Healthcare Power of Attorney Copy of Healthcare Power of Attorney in Chart?: No - copy requested Copy of Living Will in Chart?: No - copy requested Would patient like information on creating a medical advance directive?: No - Patient declined  Reviewed/Updated  Reviewed/Updated: Reviewed All (Medical, Surgical, Family, Medications, Allergies, Care Teams, Patient Goals)    Allergies (verified) Hydrocodone and Hydrocodone-acetaminophen    Current Medications (verified) Outpatient Encounter Medications as of 12/04/2024  Medication Sig   albuterol  (VENTOLIN  HFA) 108 (90 Base) MCG/ACT inhaler Inhale 2 puffs into the lungs every 6 (six) hours as needed for wheezing or shortness of breath.   Alcohol Swabs (DROPSAFE ALCOHOL PREP) 70 % PADS USE ONE TIME DAILY TO CHECK BLOOD GLUCOSE   aspirin 81 MG tablet Take 81 mg by mouth daily.   Blood Glucose Calibration (ACCU-CHEK AVIVA) SOLN USE TO CHECK GLUCOSE READINGS ONCE A DAY.   Blood Glucose Monitoring Suppl (TRUE METRIX AIR GLUCOSE METER) DEVI Check glucose one time daily   Blood Glucose Monitoring Suppl (TRUE METRIX AIR GLUCOSE METER) w/Device KIT USE AS DIRECTED   cholecalciferol (VITAMIN D ) 1000 UNITS tablet Take 1,000 Units by  mouth daily.    cyanocobalamin  (VITAMIN B12) 1000 MCG tablet Take 1 tablet (1,000 mcg total) by mouth daily.   diclofenac  Sodium (VOLTAREN ) 1 % GEL Apply 2 g topically 4 (four) times daily. APPLY TO LEFT KNEE   ezetimibe  (ZETIA ) 10 MG tablet Take 1 tablet (10 mg total) by mouth daily.   glucose blood (TRUE METRIX BLOOD GLUCOSE TEST) test strip Use as instructed   latanoprost (XALATAN) 0.005 % ophthalmic solution Place 1 drop into both eyes at bedtime.   lisinopril -hydrochlorothiazide  (ZESTORETIC ) 20-25 MG tablet TAKE 1 TABLET EVERY DAY   metoprolol  tartrate (LOPRESSOR ) 100 MG tablet TAKE 1 TABLET TWICE DAILY   potassium chloride  (KLOR-CON  M) 10 MEQ tablet TAKE 1 TABLET EVERY DAY   simvastatin  (ZOCOR ) 10 MG tablet TAKE 1 TABLET AT BEDTIME   tiZANidine  (ZANAFLEX ) 2 MG tablet Take 1 tablet (2 mg total) by mouth every 8 (eight) hours as needed for muscle spasms.   TRUEplus Lancets 30G MISC CHECK GLUCOSE ONE TIME DAILY   No facility-administered encounter medications on file as of 12/04/2024.    History: Past Medical History:  Diagnosis Date   Arthritis    shoulder   Hypertension    Past Surgical History:  Procedure Laterality Date   ABDOMINAL HYSTERECTOMY  [id number]   GALLBLADDER SURGERY  [id number]   Family History  Problem Relation Age of Onset   Diabetes Sister    Hypertension Sister    Hypertension Brother    Diabetes Sister    Hypertension Sister    Stroke Sister    Heart disease Brother    Social History   Occupational History   Not on file  Tobacco Use   Smoking status: Never   Smokeless tobacco: Never  Vaping Use   Vaping status: Never Used  Substance and Sexual Activity   Alcohol use: Yes    Alcohol/week: 1.0 standard drink of alcohol    Types: 1 Glasses of wine per week   Drug use: Not on file   Sexual activity: Not on file   Tobacco Counseling Counseling given: No  SDOH Screenings   Food Insecurity: No Food Insecurity (12/04/2024)  Housing: Low Risk (12/04/2024)   Transportation Needs: No Transportation Needs (12/04/2024)  Utilities: Not At Risk (12/04/2024)  Alcohol Screen: Low Risk (12/04/2024)  Depression (PHQ2-9): Low Risk (12/04/2024)  Financial Resource Strain: Low Risk (12/04/2024)  Physical Activity: Sufficiently Active (12/04/2024)  Social Connections: Moderately Integrated (12/04/2024)  Stress: No Stress Concern Present (12/04/2024)  Tobacco Use: Low Risk (12/04/2024)  Health Literacy: Adequate Health Literacy (12/04/2024)   See flowsheets for full screening details  Depression Screen PHQ 2 & 9 Depression Scale- Over the past 2 weeks, how often have you been bothered by any of the following problems? Little interest or pleasure in doing things: 0 Feeling down, depressed, or hopeless (PHQ Adolescent also includes...irritable): 0 PHQ-2 Total Score: 0 Trouble falling or staying asleep, or sleeping too much: 0 Feeling tired or having little energy: 0 Poor appetite or overeating (PHQ Adolescent also includes...weight loss): 0 Feeling bad about yourself - or that you are a failure or have let yourself or your family down: 0 Trouble concentrating on things, such as reading the newspaper or watching television (PHQ Adolescent also includes...like school work): 0 Moving or speaking so slowly that other people could have noticed. Or the opposite - being so fidgety or restless that you have been moving around a lot more than usual: 0 Thoughts that you would be better off dead, or  of hurting yourself in some way: 0 PHQ-9 Total Score: 0 If you checked off any problems, how difficult have these problems made it for you to do your work, take care of things at home, or get along with other people?: Not difficult at all     Goals Addressed               This Visit's Progress     Activity and Exercise Increased (pt-stated)        Evidence-based guidance:  Review current exercise levels.  Assess patient perspective on exercise or activity  level, barriers to increasing activity, motivation and readiness for change.  Recommend or set healthy exercise goal based on individual tolerance.  Encourage small steps toward making change in amount of exercise or activity.  Urge reduction of sedentary activities or screen time.  Promote group activities within the community or with family or support person.  Consider referral to rehabiliation therapist for assessment and exercise/activity plan.   Notes:              Objective:    Today's Vitals   12/04/24 1353  BP: 120/78  Pulse: 84  SpO2: 97%  Weight: 204 lb (92.5 kg)  Height: 5' 1 (1.549 m)  PainSc: 0-No pain   Body mass index is 38.55 kg/m.  Hearing/Vision screen Vision Screening - Comments:: Patient states she is up to date with her yearly eye exam Immunizations and Health Maintenance Health Maintenance  Topic Date Due   Zoster Vaccines- Shingrix (1 of 2) 06/19/1963   COVID-19 Vaccine (5 - 2025-26 season) 08/13/2024   OPHTHALMOLOGY EXAM  09/15/2024   Mammogram  12/22/2024   FOOT EXAM  05/08/2025   HEMOGLOBIN A1C  05/28/2025   Diabetic kidney evaluation - eGFR measurement  11/27/2025   Diabetic kidney evaluation - Urine ACR  11/27/2025   Medicare Annual Wellness (AWV)  12/04/2025   DTaP/Tdap/Td (3 - Td or Tdap) 10/07/2032   Pneumococcal Vaccine: 50+ Years  Completed   Influenza Vaccine  Completed   Bone Density Scan  Completed   Meningococcal B Vaccine  Aged Out   Colonoscopy  Discontinued        Assessment/Plan:  This is a routine wellness examination for Sharon Russell.  Patient Care Team: Perri Ronal PARAS, MD as PCP - General (Internal Medicine)  I have personally reviewed and noted the following in the patients chart:   Medical and social history Use of alcohol, tobacco or illicit drugs  Current medications and supplements including opioid prescriptions. Functional ability and status Nutritional status Physical activity Advanced directives List of  other physicians Hospitalizations, surgeries, and ER visits in previous 12 months Vitals Screenings to include cognitive, depression, and falls Referrals and appointments  Orders Placed This Encounter  Procedures   Flu vaccine HIGH DOSE PF(Fluzone Trivalent)   In addition, I have reviewed and discussed with patient certain preventive protocols, quality metrics, and best practice recommendations. A written personalized care plan for preventive services as well as general preventive health recommendations were provided to patient.   Sharon Russell, CMA   12/04/2024   Return in 1 year (on 12/04/2025).  After Visit Summary: (In Person-Printed) AVS printed and given to the patient  Nurse Notes: none "

## 2024-12-04 NOTE — Patient Instructions (Signed)
 Ms. Britain,  Thank you for taking the time for your Medicare Wellness Visit. I appreciate your continued commitment to your health goals. Please review the care plan we discussed, and feel free to reach out if I can assist you further.  Please note that Annual Wellness Visits do not include a physical exam. Some assessments may be limited, especially if the visit was conducted virtually. If needed, we may recommend an in-person follow-up with your provider.  Ongoing Care Seeing your primary care provider every 3 to 6 months helps us  monitor your health and provide consistent, personalized care.   Referrals If a referral was made during today's visit and you haven't received any updates within two weeks, please contact the referred provider directly to check on the status.  Recommended Screenings:  Health Maintenance  Topic Date Due   Zoster (Shingles) Vaccine (1 of 2) 06/19/1963   COVID-19 Vaccine (5 - 2025-26 season) 08/13/2024   Eye exam for diabetics  09/15/2024   Medicare Annual Wellness Visit  11/07/2024   Breast Cancer Screening  12/22/2024   Complete foot exam   05/08/2025   Hemoglobin A1C  05/28/2025   Yearly kidney function blood test for diabetes  11/27/2025   Yearly kidney health urinalysis for diabetes  11/27/2025   DTaP/Tdap/Td vaccine (3 - Td or Tdap) 10/07/2032   Pneumococcal Vaccine for age over 25  Completed   Flu Shot  Completed   Osteoporosis screening with Bone Density Scan  Completed   Meningitis B Vaccine  Aged Out   Colon Cancer Screening  Discontinued       12/04/2024    1:58 PM  Advanced Directives  Does Patient Have a Medical Advance Directive? Yes  Type of Advance Directive Living will;Healthcare Power of Attorney  Does patient want to make changes to medical advance directive? No - Patient declined  Copy of Healthcare Power of Attorney in Chart? No - copy requested    Vision: Annual vision screenings are recommended for early detection of  glaucoma, cataracts, and diabetic retinopathy. These exams can also reveal signs of chronic conditions such as diabetes and high blood pressure.  Dental: Annual dental screenings help detect early signs of oral cancer, gum disease, and other conditions linked to overall health, including heart disease and diabetes.  Please see the attached documents for additional preventive care recommendations.

## 2024-12-21 ENCOUNTER — Other Ambulatory Visit: Payer: Self-pay | Admitting: Hematology and Oncology

## 2024-12-21 ENCOUNTER — Inpatient Hospital Stay: Attending: Hematology and Oncology

## 2024-12-21 DIAGNOSIS — Z79899 Other long term (current) drug therapy: Secondary | ICD-10-CM | POA: Insufficient documentation

## 2024-12-21 DIAGNOSIS — Z7982 Long term (current) use of aspirin: Secondary | ICD-10-CM | POA: Diagnosis not present

## 2024-12-21 DIAGNOSIS — D649 Anemia, unspecified: Secondary | ICD-10-CM | POA: Diagnosis present

## 2024-12-21 LAB — CBC WITH DIFFERENTIAL (CANCER CENTER ONLY)
Abs Immature Granulocytes: 0.01 K/uL (ref 0.00–0.07)
Basophils Absolute: 0.1 K/uL (ref 0.0–0.1)
Basophils Relative: 1 %
Eosinophils Absolute: 0.3 K/uL (ref 0.0–0.5)
Eosinophils Relative: 4 %
HCT: 32.1 % — ABNORMAL LOW (ref 36.0–46.0)
Hemoglobin: 10.7 g/dL — ABNORMAL LOW (ref 12.0–15.0)
Immature Granulocytes: 0 %
Lymphocytes Relative: 41 %
Lymphs Abs: 2.7 K/uL (ref 0.7–4.0)
MCH: 27.6 pg (ref 26.0–34.0)
MCHC: 33.3 g/dL (ref 30.0–36.0)
MCV: 82.9 fL (ref 80.0–100.0)
Monocytes Absolute: 0.7 K/uL (ref 0.1–1.0)
Monocytes Relative: 11 %
Neutro Abs: 2.9 K/uL (ref 1.7–7.7)
Neutrophils Relative %: 43 %
Platelet Count: 226 K/uL (ref 150–400)
RBC: 3.87 MIL/uL (ref 3.87–5.11)
RDW: 14.6 % (ref 11.5–15.5)
WBC Count: 6.7 K/uL (ref 4.0–10.5)
nRBC: 0 % (ref 0.0–0.2)

## 2024-12-21 LAB — VITAMIN B12: Vitamin B-12: 1992 pg/mL — ABNORMAL HIGH (ref 180–914)

## 2024-12-21 LAB — CMP (CANCER CENTER ONLY)
ALT: 14 U/L (ref 0–44)
AST: 22 U/L (ref 15–41)
Albumin: 4.4 g/dL (ref 3.5–5.0)
Alkaline Phosphatase: 112 U/L (ref 38–126)
Anion gap: 13 (ref 5–15)
BUN: 28 mg/dL — ABNORMAL HIGH (ref 8–23)
CO2: 20 mmol/L — ABNORMAL LOW (ref 22–32)
Calcium: 10 mg/dL (ref 8.9–10.3)
Chloride: 100 mmol/L (ref 98–111)
Creatinine: 1.31 mg/dL — ABNORMAL HIGH (ref 0.44–1.00)
GFR, Estimated: 41 mL/min — ABNORMAL LOW
Glucose, Bld: 110 mg/dL — ABNORMAL HIGH (ref 70–99)
Potassium: 4.5 mmol/L (ref 3.5–5.1)
Sodium: 134 mmol/L — ABNORMAL LOW (ref 135–145)
Total Bilirubin: 0.4 mg/dL (ref 0.0–1.2)
Total Protein: 7.6 g/dL (ref 6.5–8.1)

## 2024-12-23 LAB — METHYLMALONIC ACID, SERUM: Methylmalonic Acid, Quantitative: 149 nmol/L (ref 0–378)

## 2024-12-25 ENCOUNTER — Other Ambulatory Visit

## 2024-12-25 DIAGNOSIS — I10 Essential (primary) hypertension: Secondary | ICD-10-CM

## 2024-12-26 ENCOUNTER — Other Ambulatory Visit: Payer: Self-pay | Admitting: Internal Medicine

## 2024-12-26 DIAGNOSIS — N183 Chronic kidney disease, stage 3 unspecified: Secondary | ICD-10-CM

## 2024-12-26 LAB — BASIC METABOLIC PANEL WITH GFR
BUN/Creatinine Ratio: 21 (calc) (ref 6–22)
BUN: 26 mg/dL — ABNORMAL HIGH (ref 7–25)
CO2: 21 mmol/L (ref 20–32)
Calcium: 9.6 mg/dL (ref 8.6–10.4)
Chloride: 105 mmol/L (ref 98–110)
Creat: 1.22 mg/dL — ABNORMAL HIGH (ref 0.60–0.95)
Glucose, Bld: 97 mg/dL (ref 65–99)
Potassium: 4.9 mmol/L (ref 3.5–5.3)
Sodium: 136 mmol/L (ref 135–146)
eGFR: 45 mL/min/1.73m2 — ABNORMAL LOW

## 2024-12-28 ENCOUNTER — Other Ambulatory Visit: Payer: Self-pay | Admitting: Hematology and Oncology

## 2024-12-28 ENCOUNTER — Inpatient Hospital Stay: Admitting: Hematology and Oncology

## 2024-12-28 VITALS — BP 190/88 | HR 65 | Temp 98.2°F | Resp 13 | Wt 207.6 lb

## 2024-12-28 DIAGNOSIS — D649 Anemia, unspecified: Secondary | ICD-10-CM

## 2024-12-28 NOTE — Progress Notes (Signed)
 " [facility] [phone]   [phone]  PROGRESS NOTE  Patient Care Team: Perri Sharon PARAS, MD as PCP - General (Internal Medicine)  Hematological/Oncological History # Normocytic Anemia   Interval History:  Sharon Russell 79 y.o. female with medical history significant for normocytic anemia of unclear etiology who presents for a follow up visit. The patient's last visit was on 06/08/2024. In the interim since the last visit has continued on B12 supplementation.  On exam today Sharon Russell reports that she has been well overall.  She reports that she has had no trouble with lightheadedness, dizziness, or shortness of breath.  She denies any bleeding, bruising, or dark stools.  She has not had any trouble with runny nose, sore throat, cough.  She reports that she did have a UTI recently and did require no antibiotics.  She reports that she has had no trouble with fevers, chills, sweats.  She has had no recent hospitalizations, ER visits, or other changes in her health.  Her hemoglobin remains steady at 10.7 with no change from our last visit.  We discussed options moving forward including bone marrow biopsy versus observation.  She opted for observation at this time.  A full 10 point ROS is otherwise negative.  MEDICAL HISTORY:  Past Medical History:  Diagnosis Date   Arthritis    shoulder   Hypertension     SURGICAL HISTORY: Past Surgical History:  Procedure Laterality Date   ABDOMINAL HYSTERECTOMY  [id number]   GALLBLADDER SURGERY  [id number]    SOCIAL HISTORY: Social History   Socioeconomic History   Marital status: Married    Spouse name: Not on file   Number of children: Not on file   Years of education: Not on file   Highest education level: Not on file  Occupational History   Not on file  Tobacco Use   Smoking status: Never   Smokeless tobacco: Never  Vaping Use   Vaping status: Never Used  Substance and Sexual Activity   Alcohol use: Yes     Alcohol/week: 1.0 standard drink of alcohol    Types: 1 Glasses of wine per week   Drug use: Not on file   Sexual activity: Not on file  Other Topics Concern   Not on file  Social History Narrative   Not on file   Social Drivers of Health   Tobacco Use: Low Risk (12/04/2024)   Patient History    Smoking Tobacco Use: Never    Smokeless Tobacco Use: Never    Passive Exposure: Not on file  Financial Resource Strain: Low Risk (12/04/2024)   Overall Financial Resource Strain (CARDIA)    Difficulty of Paying Living Expenses: Not very hard  Food Insecurity: No Food Insecurity (12/04/2024)   Epic    Worried About Radiation Protection Practitioner of Food in the Last Year: Never true    Ran Out of Food in the Last Year: Never true  Transportation Needs: No Transportation Needs (12/04/2024)   Epic    Lack of Transportation (Medical): No    Lack of Transportation (Non-Medical): No  Physical Activity: Sufficiently Active (12/04/2024)   Exercise Vital Sign    Days of Exercise per Week: 4 days    Minutes of Exercise per Session: 40 min  Stress: No Stress Concern Present (12/04/2024)   [employer] of Occupational Health - Occupational Stress Questionnaire    Feeling of Stress: Not at all  Social Connections: Moderately Integrated (12/04/2024)   Social Connection and  Isolation Panel    Frequency of Communication with Friends and Family: More than three times a week    Frequency of Social Gatherings with Friends and Family: Twice a week    Attends Religious Services: More than 4 times per year    Active Member of [employer] or Organizations: Yes    Attends Banker Meetings: More than 4 times per year    Marital Status: Widowed  Intimate Partner Violence: Not At Risk (12/04/2024)   Epic    Fear of Current or Ex-Partner: No    Emotionally Abused: No    Physically Abused: No    Sexually Abused: No  Depression (PHQ2-9): Low Risk (12/04/2024)   Depression (PHQ2-9)    PHQ-2 Score: 0  Alcohol  Screen: Low Risk (12/04/2024)   Alcohol Screen    Last Alcohol Screening Score (AUDIT): 4  Housing: Low Risk (12/04/2024)   Epic    Unable to Pay for Housing in the Last Year: No    Number of Times Moved in the Last Year: 0    Homeless in the Last Year: No  Utilities: Not At Risk (12/04/2024)   Epic    Threatened with loss of utilities: No  Health Literacy: Adequate Health Literacy (12/04/2024)   B1300 Health Literacy    Frequency of need for help with medical instructions: Rarely    FAMILY HISTORY: Family History  Problem Relation Age of Onset   Diabetes Sister    Hypertension Sister    Hypertension Brother    Diabetes Sister    Hypertension Sister    Stroke Sister    Heart disease Brother     ALLERGIES:  is allergic to hydrocodone and hydrocodone-acetaminophen .  MEDICATIONS:  Current Outpatient Medications  Medication Sig Dispense Refill   albuterol  (VENTOLIN  HFA) 108 (90 Base) MCG/ACT inhaler Inhale 2 puffs into the lungs every 6 (six) hours as needed for wheezing or shortness of breath. 17 each PRN   Alcohol Swabs (DROPSAFE ALCOHOL PREP) 70 % PADS USE ONE TIME DAILY TO CHECK BLOOD GLUCOSE 100 each prn   aspirin 81 MG tablet Take 81 mg by mouth daily.     Blood Glucose Calibration (ACCU-CHEK AVIVA) SOLN USE TO CHECK GLUCOSE READINGS ONCE A DAY. 1 each 0   Blood Glucose Monitoring Suppl (TRUE METRIX AIR GLUCOSE METER) DEVI Check glucose one time daily 1 each 3   Blood Glucose Monitoring Suppl (TRUE METRIX AIR GLUCOSE METER) w/Device KIT USE AS DIRECTED 1 kit 3   cholecalciferol (VITAMIN D ) 1000 UNITS tablet Take 1,000 Units by mouth daily.     cyanocobalamin  (VITAMIN B12) 1000 MCG tablet Take 1 tablet (1,000 mcg total) by mouth daily.     diclofenac  Sodium (VOLTAREN ) 1 % GEL Apply 2 g topically 4 (four) times daily. APPLY TO LEFT KNEE 50 g 0   ezetimibe  (ZETIA ) 10 MG tablet Take 1 tablet (10 mg total) by mouth daily. 90 tablet 3   glucose blood (TRUE METRIX BLOOD GLUCOSE  TEST) test strip Use as instructed 100 strip 3   latanoprost (XALATAN) 0.005 % ophthalmic solution Place 1 drop into both eyes at bedtime.     lisinopril -hydrochlorothiazide  (ZESTORETIC ) 20-25 MG tablet TAKE 1 TABLET EVERY DAY 90 tablet 3   metoprolol  tartrate (LOPRESSOR ) 100 MG tablet TAKE 1 TABLET TWICE DAILY 180 tablet 3   potassium chloride  (KLOR-CON  M) 10 MEQ tablet TAKE 1 TABLET EVERY DAY 90 tablet 3   simvastatin  (ZOCOR ) 10 MG tablet TAKE 1 TABLET AT BEDTIME  90 tablet 3   tiZANidine  (ZANAFLEX ) 2 MG tablet Take 1 tablet (2 mg total) by mouth every 8 (eight) hours as needed for muscle spasms. 30 tablet 0   TRUEplus Lancets 30G MISC CHECK GLUCOSE ONE TIME DAILY 100 each 3   No current facility-administered medications for this visit.    REVIEW OF SYSTEMS:   Constitutional: ( - ) fevers, ( - )  chills , ( - ) night sweats Eyes: ( - ) blurriness of vision, ( - ) double vision, ( - ) watery eyes Ears, nose, mouth, throat, and face: ( - ) mucositis, ( - ) sore throat Respiratory: ( - ) cough, ( - ) dyspnea, ( - ) wheezes Cardiovascular: ( - ) palpitation, ( - ) chest discomfort, ( - ) lower extremity swelling Gastrointestinal:  ( - ) nausea, ( - ) heartburn, ( - ) change in bowel habits Skin: ( - ) abnormal skin rashes Lymphatics: ( - ) new lymphadenopathy, ( - ) easy bruising Neurological: ( - ) numbness, ( - ) tingling, ( - ) new weaknesses Behavioral/Psych: ( - ) mood change, ( - ) new changes  All other systems were reviewed with the patient and are negative.  PHYSICAL EXAMINATION: Vitals:   12/28/24 1434 12/28/24 1440  BP: (!) 180/71 (!) 190/88  Pulse: 65   Resp: 13   Temp: 98.2 F (36.8 C)   SpO2: 100%    Filed Weights   12/28/24 1434  Weight: 207 lb 9.6 oz (94.2 kg)    GENERAL: Well-appearing elderly African-American female, alert, no distress and comfortable SKIN: skin color, texture, turgor are normal, no rashes or significant lesions EYES: conjunctiva are pink and  non-injected, sclera clear LUNGS: clear to auscultation and percussion with normal breathing effort HEART: regular rate & rhythm and no murmurs and no lower extremity edema Musculoskeletal: no cyanosis of digits and no clubbing  PSYCH: alert & oriented x 3, fluent speech NEURO: no focal motor/sensory deficits  LABORATORY DATA:  I have reviewed the data as listed    Latest Ref Rng & Units 12/21/2024    9:17 AM 11/27/2024   11:59 AM 06/08/2024    3:08 PM  CBC  WBC 4.0 - 10.5 K/uL 6.7  6.9  7.7   Hemoglobin 12.0 - 15.0 g/dL 89.2  89.3  89.2   Hematocrit 36.0 - 46.0 % 32.1  33.9  31.9   Platelets 150 - 400 K/uL 226  245  266        Latest Ref Rng & Units 12/25/2024    9:34 AM 12/21/2024    9:17 AM 11/27/2024   11:59 AM  CMP  Glucose 65 - 99 mg/dL 97  889  98   BUN 7 - 25 mg/dL 26  28  36   Creatinine 0.60 - 0.95 mg/dL 8.77  8.68  8.62   Sodium 135 - 146 mmol/L 136  134  138   Potassium 3.5 - 5.3 mmol/L 4.9  4.5  4.8   Chloride 98 - 110 mmol/L 105  100  105   CO2 20 - 32 mmol/L 21  20  24    Calcium 8.6 - 10.4 mg/dL 9.6  89.9  9.9   Total Protein 6.5 - 8.1 g/dL  7.6  7.4   Total Bilirubin 0.0 - 1.2 mg/dL  0.4  0.5   Alkaline Phos 38 - 126 U/L  112    AST 15 - 41 U/L  22  16   ALT  0 - 44 U/L  14  11     Lab Results  Component Value Date   MPROTEIN Not Observed 06/08/2024   Lab Results  Component Value Date   KPAFRELGTCHN 24.2 (H) 06/08/2024   LAMBDASER 16.0 06/08/2024   KAPLAMBRATIO 1.51 06/08/2024    RADIOGRAPHIC STUDIES: No results found.  ASSESSMENT & PLAN NIVEA WOJDYLA 79 y.o. female with medical history significant for arthritis and hypertension who presents for evaluation of chronic normocytic anemia.   After review of the labs, review of the records, and discussion with the patient the patients findings are most consistent with normocytic anemia of unclear etiology    # Normocytic Anemia -- no abnormalities noted with full nutritional studies including iron  panel, ferritin, vitamin B12, folate, and methylmalonic acid. -- Ruled out multiple myeloma with SPEP and serum free light chains --no clear etiology can be found, discussed role of  bone marrow biopsy. Patient notes she would prefer to avoid bone marrow biopsy and will prefer to continue with routine monitoring at this time.  I think this is reasonable given the relative stability over several years. --Labs today show white blood cell 6.7, hemoglobin 10.7, MCV 82.9, platelets 226 -- Return to clinic in 1 year to reassess  No orders of the defined types were placed in this encounter.   All questions were answered. The patient knows to call the clinic with any problems, questions or concerns.  A total of more than 30 minutes were spent on this encounter with face-to-face time and non-face-to-face time, including preparing to see the patient, ordering tests and/or medications, counseling the patient and coordination of care as outlined above.   Norleen IVAR Kidney, MD Department of Hematology/Oncology [facility] Phone: [phone] Pager: [phone] Email: [email or web address]  12/30/2024 5:14 PM  "

## 2025-01-14 ENCOUNTER — Other Ambulatory Visit

## 2025-01-22 ENCOUNTER — Other Ambulatory Visit

## 2026-01-01 ENCOUNTER — Inpatient Hospital Stay: Admitting: Hematology and Oncology

## 2026-01-01 ENCOUNTER — Inpatient Hospital Stay
